# Patient Record
Sex: Female | Born: 1985 | Race: Black or African American | Hispanic: No | Marital: Single | Smoking: Never smoker
Health system: Southern US, Community
[De-identification: ages and names within clinical notes are randomized; demographics above are authoritative.]

## PROBLEM LIST (undated history)

## (undated) ENCOUNTER — Inpatient Hospital Stay (HOSPITAL_COMMUNITY): Payer: Self-pay

## (undated) DIAGNOSIS — F329 Major depressive disorder, single episode, unspecified: Secondary | ICD-10-CM

## (undated) DIAGNOSIS — F419 Anxiety disorder, unspecified: Secondary | ICD-10-CM

## (undated) DIAGNOSIS — D219 Benign neoplasm of connective and other soft tissue, unspecified: Secondary | ICD-10-CM

## (undated) DIAGNOSIS — Z8759 Personal history of other complications of pregnancy, childbirth and the puerperium: Secondary | ICD-10-CM

## (undated) DIAGNOSIS — Z5189 Encounter for other specified aftercare: Secondary | ICD-10-CM

## (undated) HISTORY — DX: Personal history of other complications of pregnancy, childbirth and the puerperium: Z87.59

## (undated) HISTORY — DX: Encounter for other specified aftercare: Z51.89

## (undated) HISTORY — PX: NO PAST SURGERIES: SHX2092

---

## 2012-03-28 ENCOUNTER — Encounter (HOSPITAL_COMMUNITY): Payer: Self-pay | Admitting: Emergency Medicine

## 2012-03-28 ENCOUNTER — Emergency Department (HOSPITAL_COMMUNITY): Payer: No Typology Code available for payment source

## 2012-03-28 ENCOUNTER — Emergency Department (HOSPITAL_COMMUNITY)
Admission: EM | Admit: 2012-03-28 | Discharge: 2012-03-28 | Disposition: A | Discharge status: Home / Self Care | Payer: No Typology Code available for payment source | Attending: Emergency Medicine | Admitting: Emergency Medicine

## 2012-03-28 DIAGNOSIS — M545 Low back pain, unspecified: Secondary | ICD-10-CM | POA: Insufficient documentation

## 2012-03-28 DIAGNOSIS — M542 Cervicalgia: Secondary | ICD-10-CM | POA: Insufficient documentation

## 2012-03-28 DIAGNOSIS — M546 Pain in thoracic spine: Secondary | ICD-10-CM | POA: Insufficient documentation

## 2012-03-28 MED ORDER — DIAZEPAM 5 MG PO TABS: 5.0000 mg | ORAL_TABLET | Freq: Two times a day (BID) | ORAL | Status: DC

## 2012-03-28 MED ORDER — HYDROCODONE-ACETAMINOPHEN 5-325 MG PO TABS: 2.0000 | ORAL_TABLET | Freq: Four times a day (QID) | ORAL | Status: DC | PRN
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
## 2012-03-29 NOTE — ED Provider Notes (Signed)
Medical screening examination/treatment/procedure(s) were performed by non-physician practitioner and as supervising physician I was immediately available for consultation/collaboration.  Hadessah Grennan, MD 03/29/12 1210 

## 2014-02-27 ENCOUNTER — Emergency Department (HOSPITAL_COMMUNITY): Payer: Medicaid Other

## 2014-02-27 ENCOUNTER — Emergency Department (HOSPITAL_COMMUNITY)
Admission: EM | Admit: 2014-02-27 | Discharge: 2014-02-27 | Disposition: A | Discharge status: Home / Self Care | Payer: Medicaid Other | Attending: Emergency Medicine | Admitting: Emergency Medicine

## 2014-02-27 ENCOUNTER — Encounter (HOSPITAL_COMMUNITY): Payer: Self-pay | Admitting: Emergency Medicine

## 2014-02-27 DIAGNOSIS — Z3490 Encounter for supervision of normal pregnancy, unspecified, unspecified trimester: Secondary | ICD-10-CM

## 2014-02-27 DIAGNOSIS — O209 Hemorrhage in early pregnancy, unspecified: Secondary | ICD-10-CM | POA: Diagnosis present

## 2014-02-27 DIAGNOSIS — O200 Threatened abortion: Secondary | ICD-10-CM | POA: Insufficient documentation

## 2014-02-27 LAB — URINALYSIS, ROUTINE W REFLEX MICROSCOPIC
BILIRUBIN URINE: NEGATIVE
Bilirubin Urine: NEGATIVE
GLUCOSE, UA: NEGATIVE mg/dL
GLUCOSE, UA: NEGATIVE mg/dL
HGB URINE DIPSTICK: NEGATIVE
Hgb urine dipstick: NEGATIVE
KETONES UR: NEGATIVE mg/dL
Ketones, ur: NEGATIVE mg/dL
LEUKOCYTES UA: NEGATIVE
Leukocytes, UA: NEGATIVE
Nitrite: NEGATIVE
Nitrite: NEGATIVE
PH: 8.5 — AB (ref 5.0–8.0)
PH: 7.5 (ref 5.0–8.0)
Protein, ur: NEGATIVE mg/dL
Protein, ur: NEGATIVE mg/dL
SPECIFIC GRAVITY, URINE: 1.019 (ref 1.005–1.030)
SPECIFIC GRAVITY, URINE: 1.021 (ref 1.005–1.030)
UROBILINOGEN UA: 1 mg/dL (ref 0.0–1.0)
Urobilinogen, UA: 1 mg/dL (ref 0.0–1.0)

## 2014-02-27 LAB — WET PREP, GENITAL
CLUE CELLS WET PREP: NONE SEEN
TRICH WET PREP: NONE SEEN
YEAST WET PREP: NONE SEEN

## 2014-02-27 LAB — RPR

## 2014-02-27 LAB — ABO/RH: ABO/RH(D): AB POS

## 2014-02-27 LAB — POC URINE PREG, ED: Preg Test, Ur: POSITIVE — AB

## 2014-02-27 LAB — HCG, QUANTITATIVE, PREGNANCY: hCG, Beta Chain, Quant, S: 14578 m[IU]/mL — ABNORMAL HIGH (ref <5)

## 2014-02-27 NOTE — Discharge Instructions (Signed)
Continue your prenatal vitamins. Do pelvic rest until you see the OB doctors. Call the [facility] to get an appointment to be seen. If you get bleeding like a period, abdominal pain, or seem worse go to [facility] to the Maternity Admissions Unit to be evaluated.

## 2014-02-27 NOTE — ED Notes (Signed)
Pt sts positive pregnancy test at home and now c/o vaginal spotting; pt G3 P1 M1; pt sts LMP was 12/15/13

## 2014-02-27 NOTE — ED Notes (Signed)
Patient transported to Ultrasound 

## 2014-02-27 NOTE — ED Provider Notes (Signed)
CSN: [id number]     Arrival date & time 02/27/14  1043 History   First MD Initiated Contact with Patient 02/27/14 1108     Chief Complaint  Patient presents with  . Vaginal Bleeding     (Consider location/radiation/quality/duration/timing/severity/associated sxs/prior Treatment) HPI Patient states she is G3 P1 Ab1 at 3 months of pregnancy. She states her miscarriage was her first pregnancy and then she had a totally normal pregnancy with a live birth baby. She reports her last normal period started around June 15. She was not using any birth control at that time. She states however she was hoping to get pregnant. She states she had a positive pregnancy test 3 days ago when she was having nausea which made her do the test. She also has noted some tenderness of her breasts. She states about 30 minutes ago she went to the bathroom to urinate and when she wiped she saw some spots of blood on the tissue. She states there is a minimal amount of blood in the toilet water. She did not have cramping them but she started to have some mild cramping now of her lower abdomen. She denies dysuria but she has had some frequency. She is unsure of her blood type but does not recognize RhoGAM.  PCP none GYN none  History reviewed. No pertinent past medical history. History reviewed. No pertinent past surgical history. History reviewed. No pertinent family history. History  Substance Use Topics  . Smoking status: Never Smoker   . Smokeless tobacco: Never Used  . Alcohol Use: Yes     Comment: occ   employed  OB History   Grav Para Term Preterm Abortions TAB SAB Ect Mult Living   1              Review of Systems  All other systems reviewed and are negative.     Allergies  Review of patient's allergies indicates no known allergies.  Home Medications  none  BP 110/60  Pulse 81  Temp(Src) 98.8 F (37.1 C) (Oral)  Resp 20  Ht 5\' 3"  (1.6 m)  Wt 180 lb (81.647 kg)  BMI 31.89 kg/m2  SpO2 97%   LMP 12/15/2013  Vital signs normal   Physical Exam  Nursing note and vitals reviewed. Constitutional: She is oriented to person, place, and time. She appears well-developed and well-nourished.  Non-toxic appearance. She does not appear ill. No distress.  HENT:  Head: Normocephalic and atraumatic.  Right Ear: External ear normal.  Left Ear: External ear normal.  Nose: Nose normal. No mucosal edema or rhinorrhea.  Mouth/Throat: Oropharynx is clear and moist and mucous membranes are normal. No dental abscesses or uvula swelling.  Eyes: Conjunctivae and EOM are normal. Pupils are equal, round, and reactive to light.  Neck: Normal range of motion and full passive range of motion without pain. Neck supple.  Cardiovascular: Normal rate, regular rhythm and normal heart sounds.  Exam reveals no gallop and no friction rub.   No murmur heard. Pulmonary/Chest: Effort normal and breath sounds normal. No respiratory distress. She has no wheezes. She has no rhonchi. She has no rales. She exhibits no tenderness and no crepitus.  Abdominal: Soft. Normal appearance and bowel sounds are normal. She exhibits no distension. There is no tenderness. There is no rebound and no guarding.  Genitourinary:  No blood seen in the fall. There is a small amount white discharge. Her cervix appears closed and normal her uterus is normal size and nontender. Her  ovaries are not enlarged and are nontender.  Musculoskeletal: Normal range of motion. She exhibits no edema and no tenderness.  Moves all extremities well.   Neurological: She is alert and oriented to person, place, and time. She has normal strength. No cranial nerve deficit.  Skin: Skin is warm, dry and intact. No rash noted. No erythema. No pallor.  Psychiatric: She has a normal mood and affect. Her speech is normal and behavior is normal. Her mood appears not anxious.    ED Course  Procedures (including critical care time)   EMERGENCY DEPARTMENT [place]  PREGNANCY "Study: Limited Ultrasound of the Pelvis"  INDICATIONS:Pregnancy(required) and Vaginal bleeding Multiple views of the uterus and pelvic cavity are obtained with a multi-frequency probe.  APPROACH:Transabdominal   PERFORMED BY: Myself  IMAGES ARCHIVED?: Yes  LIMITATIONS: Decompressed bladder  PREGNANCY FREE FLUID: None  PREGNANCY UTERUS FINDINGS:Uterus normal size and Gestational sac noted    Pt given results of her formal US. Discussed pelvic rest, continuing prenatal vitamins and OB follow up.   Labs Review Results for orders placed during the hospital encounter of 02/27/14  WET PREP, GENITAL      Result Value Ref Range   Yeast Wet Prep HPF POC NONE SEEN  NONE SEEN   Trich, Wet Prep NONE SEEN  NONE SEEN   Clue Cells Wet Prep HPF POC NONE SEEN  NONE SEEN   WBC, Wet Prep HPF POC MODERATE (*) NONE SEEN  URINALYSIS, ROUTINE W REFLEX MICROSCOPIC      Result Value Ref Range   Color, Urine YELLOW  YELLOW   APPearance CLEAR  CLEAR   Specific Gravity, Urine 1.019  1.005 - 1.030   pH 8.5 (*) 5.0 - 8.0   Glucose, UA NEGATIVE  NEGATIVE mg/dL   Hgb urine dipstick NEGATIVE  NEGATIVE   Bilirubin Urine NEGATIVE  NEGATIVE   Ketones, ur NEGATIVE  NEGATIVE mg/dL   Protein, ur NEGATIVE  NEGATIVE mg/dL   Urobilinogen, UA 1.0  0.0 - 1.0 mg/dL   Nitrite NEGATIVE  NEGATIVE   Leukocytes, UA NEGATIVE  NEGATIVE  HCG, QUANTITATIVE, PREGNANCY      Result Value Ref Range   hCG, Beta Chain, Quant, S 14578 (*) <5 mIU/mL  URINALYSIS, ROUTINE W REFLEX MICROSCOPIC      Result Value Ref Range   Color, Urine YELLOW  YELLOW   APPearance CLEAR  CLEAR   Specific Gravity, Urine 1.021  1.005 - 1.030   pH 7.5  5.0 - 8.0   Glucose, UA NEGATIVE  NEGATIVE mg/dL   Hgb urine dipstick NEGATIVE  NEGATIVE   Bilirubin Urine NEGATIVE  NEGATIVE   Ketones, ur NEGATIVE  NEGATIVE mg/dL   Protein, ur NEGATIVE  NEGATIVE mg/dL   Urobilinogen, UA 1.0  0.0 - 1.0 mg/dL   Nitrite NEGATIVE  NEGATIVE    Leukocytes, UA NEGATIVE  NEGATIVE  POC URINE PREG, ED      Result Value Ref Range   Preg Test, Ur POSITIVE (*) NEGATIVE  ABO/RH      Result Value Ref Range   ABO/RH(D) AB POS     No rh immune globuloin NOT A RH IMMUNE GLOBULIN CANDIDATE, PT RH POSITIVE     Laboratory interpretation all normal      Imaging Review US Ob Comp Less 14 Wks  02/27/2014   CLINICAL DATA:  Vaginal bleeding  EXAM: OBSTETRIC <14 WK [place] AND TRANSVAGINAL OB US  TECHNIQUE: Both transabdominal and transvaginal ultrasound examinations were performed for complete evaluation of the  gestation as well as the maternal uterus, adnexal regions, and pelvic cul-de-sac. Transvaginal technique was performed to assess early pregnancy.  COMPARISON:  None.  FINDINGS: Intrauterine gestational sac: Visualized/normal in shape.  Yolk sac:  Visualized  Embryo:  Not visualized  Cardiac Activity: Not visualized  MSD:  13  mm   6 w   1  d  Maternal uterus/adnexae: There is no evidence suggesting subchorionic hemorrhage. Cervical os is closed. No masses are identified within the maternal uterus. The right ovary measures 4.5 x 2.4 x 2.2 cm. Left ovary measures 2.5 x 1.6 x 2.7 cm. There is an apparent corpus luteum on the right measuring 2.4 x 2.3 x 1.9 cm. There is a small amount of free fluid in the cul-de-sac.  IMPRESSION: Within the uterus, there is a gestational sac which contains a yolk sac. Fetal pole is not seen at this time. In this regard, advise followup study in approximately 10-14 days to further assess. Small amount of free fluid in cul-de-sac. Corpus luteum right ovary.   Electronically Signed   By: Lowella Grip M.D.   On: 02/27/2014 14:08     EKG Interpretation None      MDM   Final diagnoses:  Pregnancy  Threatened abortion in early pregnancy     Plan discharge   Rolland Porter, MD, Alanson Aly, MD 02/27/14 1504

## 2014-02-27 NOTE — ED Notes (Signed)
Patient returned from Ultrasound. 

## 2014-02-28 LAB — GC/CHLAMYDIA PROBE AMP
CT PROBE, AMP APTIMA: NEGATIVE
GC PROBE AMP APTIMA: NEGATIVE

## 2014-02-28 LAB — HIV ANTIBODY (ROUTINE TESTING W REFLEX): HIV 1&2 Ab, 4th Generation: NONREACTIVE

## 2014-03-26 ENCOUNTER — Encounter (HOSPITAL_COMMUNITY): Payer: Self-pay | Admitting: *Deleted

## 2014-03-26 ENCOUNTER — Inpatient Hospital Stay (HOSPITAL_COMMUNITY)
Admission: AD | Admit: 2014-03-26 | Discharge: 2014-03-26 | Disposition: A | Discharge status: Home / Self Care | Payer: Medicaid Other | Source: Ambulatory Visit | Attending: Family Medicine | Admitting: Family Medicine

## 2014-03-26 DIAGNOSIS — O210 Mild hyperemesis gravidarum: Secondary | ICD-10-CM | POA: Insufficient documentation

## 2014-03-26 DIAGNOSIS — R42 Dizziness and giddiness: Secondary | ICD-10-CM | POA: Insufficient documentation

## 2014-03-26 DIAGNOSIS — R519 Headache, unspecified: Secondary | ICD-10-CM

## 2014-03-26 DIAGNOSIS — O26891 Other specified pregnancy related conditions, first trimester: Secondary | ICD-10-CM

## 2014-03-26 DIAGNOSIS — O9989 Other specified diseases and conditions complicating pregnancy, childbirth and the puerperium: Secondary | ICD-10-CM

## 2014-03-26 DIAGNOSIS — R51 Headache: Secondary | ICD-10-CM

## 2014-03-26 LAB — CBC
HCT: 35.2 % — ABNORMAL LOW (ref 36.0–46.0)
Hemoglobin: 12 g/dL (ref 12.0–15.0)
MCH: 29.2 pg (ref 26.0–34.0)
MCHC: 34.1 g/dL (ref 30.0–36.0)
MCV: 85.6 fL (ref 78.0–100.0)
PLATELETS: 275 10*3/uL (ref 150–400)
RBC: 4.11 MIL/uL (ref 3.87–5.11)
RDW: 13.4 % (ref 11.5–15.5)
WBC: 5.3 10*3/uL (ref 4.0–10.5)

## 2014-03-26 LAB — URINALYSIS, ROUTINE W REFLEX MICROSCOPIC
Bilirubin Urine: NEGATIVE
Glucose, UA: NEGATIVE mg/dL
Hgb urine dipstick: NEGATIVE
KETONES UR: NEGATIVE mg/dL
LEUKOCYTES UA: NEGATIVE
NITRITE: NEGATIVE
PROTEIN: NEGATIVE mg/dL
Specific Gravity, Urine: 1.015 (ref 1.005–1.030)
UROBILINOGEN UA: 1 mg/dL (ref 0.0–1.0)
pH: 7.5 (ref 5.0–8.0)

## 2014-03-26 LAB — COMPREHENSIVE METABOLIC PANEL
ALBUMIN: 3.2 g/dL — AB (ref 3.5–5.2)
ALK PHOS: 42 U/L (ref 39–117)
ALT: 7 U/L (ref 0–35)
ANION GAP: 12 (ref 5–15)
AST: 12 U/L (ref 0–37)
BILIRUBIN TOTAL: 0.2 mg/dL — AB (ref 0.3–1.2)
BUN: 11 mg/dL (ref 6–23)
CHLORIDE: 103 meq/L (ref 96–112)
CO2: 23 mEq/L (ref 19–32)
CREATININE: 0.74 mg/dL (ref 0.50–1.10)
Calcium: 9.2 mg/dL (ref 8.4–10.5)
GFR calc Af Amer: >90 mL/min (ref >90)
GFR calc non Af Amer: >90 mL/min (ref >90)
Glucose, Bld: 88 mg/dL (ref 70–99)
POTASSIUM: 4 meq/L (ref 3.7–5.3)
Sodium: 138 mEq/L (ref 137–147)
TOTAL PROTEIN: 7.1 g/dL (ref 6.0–8.3)

## 2014-03-26 MED ORDER — CYCLOBENZAPRINE HCL 10 MG PO TABS: 5.0000 mg | ORAL_TABLET | Freq: Three times a day (TID) | ORAL | Status: DC | PRN

## 2014-03-26 NOTE — MAU Provider Note (Signed)
Attestation of Attending Supervision of Advanced Practitioner (PA/CNM/NP): Evaluation and management procedures were performed by the Advanced Practitioner under my supervision and collaboration.  I have reviewed the Advanced Practitioner's note and chart, and I agree with the management and plan.  Donnamae Jude, MD [facility] Attending 03/26/2014 11:07 PM

## 2014-03-26 NOTE — Discharge Instructions (Signed)
Dizziness °Dizziness is a common problem. It is a feeling of unsteadiness or light-headedness. You may feel like you are about to faint. Dizziness can lead to injury if you stumble or fall. A person of any age group can suffer from dizziness, but dizziness is more common in older adults. °CAUSES  °Dizziness can be caused by many different things, including: °· Middle ear problems. °· Standing for too long. °· Infections. °· An allergic reaction. °· Aging. °· An emotional response to something, such as the sight of blood. °· Side effects of medicines. °· Tiredness. °· Problems with circulation or blood pressure. °· Excessive use of alcohol or medicines, or illegal drug use. °· Breathing too fast (hyperventilation). °· An irregular heart rhythm (arrhythmia). °· A low red blood cell count (anemia). °· Pregnancy. °· Vomiting, diarrhea, fever, or other illnesses that cause body fluid loss (dehydration). °· Diseases or conditions such as Parkinson's disease, high blood pressure (hypertension), diabetes, and thyroid problems. °· Exposure to extreme heat. °DIAGNOSIS  °Your health care provider will ask about your symptoms, perform a physical exam, and perform an electrocardiogram (ECG) to record the electrical activity of your heart. Your health care provider may also perform other heart or blood tests to determine the cause of your dizziness. These may include: °· Transthoracic echocardiogram (TTE). During echocardiography, sound waves are used to evaluate how blood flows through your heart. °· Transesophageal echocardiogram (TEE). °· Cardiac monitoring. This allows your health care provider to monitor your heart rate and rhythm in real time. °· Holter monitor. This is a portable device that records your heartbeat and can help diagnose heart arrhythmias. It allows your health care provider to track your heart activity for several days if needed. °· Stress tests by exercise or by giving medicine that makes the heart beat  faster. °TREATMENT  °Treatment of dizziness depends on the cause of your symptoms and can vary greatly. °HOME CARE INSTRUCTIONS  °· Drink enough fluids to keep your urine clear or pale yellow. This is especially important in very hot weather. In older adults, it is also important in cold weather. °· Take your medicine exactly as directed if your dizziness is caused by medicines. When taking blood pressure medicines, it is especially important to get up slowly. °¨ Rise slowly from chairs and steady yourself until you feel okay. °¨ In the morning, first sit up on the side of the bed. When you feel okay, stand slowly while holding onto something until you know your balance is fine. °· Move your legs often if you need to stand in one place for a long time. Tighten and relax your muscles in your legs while standing. °· Have someone stay with you for 1-2 days if dizziness continues to be a problem. Do this until you feel you are well enough to stay alone. Have the person call your health care provider if he or she notices changes in you that are concerning. °· Do not drive or use heavy machinery if you feel dizzy. °· Do not drink alcohol. °SEEK IMMEDIATE MEDICAL CARE IF:  °· Your dizziness or light-headedness gets worse. °· You feel nauseous or vomit. °· You have problems talking, walking, or using your arms, hands, or legs. °· You feel weak. °· You are not thinking clearly or you have trouble forming sentences. It may take a friend or family member to notice this. °· You have chest pain, abdominal pain, shortness of breath, or sweating. °· Your vision changes. °· You notice   any bleeding.  You have side effects from medicine that seems to be getting worse rather than better. MAKE SURE YOU:   Understand these instructions.  Will watch your condition.  Will get help right away if you are not doing well or get worse. Document Released: 12/13/2000 Document Revised: 06/24/2013 Document Reviewed: 01/06/2011 [facility]  Patient Information 2015 [place], [place]. This information is not intended to replace advice given to you by your health care provider. Make sure you discuss any questions you have with your health care provider.  Tension Headache A tension headache is a feeling of pain, pressure, or aching often felt over the front and sides of the head. The pain can be dull or can feel tight (constricting). It is the most common type of headache. Tension headaches are not normally associated with nausea or vomiting and do not get worse with physical activity. Tension headaches can last 30 minutes to several days.  CAUSES  The exact cause is not known, but it may be caused by chemicals and hormones in the brain that lead to pain. Tension headaches often begin after stress, anxiety, or depression. Other triggers may include:  Alcohol.  Caffeine (too much or withdrawal).  Respiratory infections (colds, flu, sinus infections).  Dental problems or teeth clenching.  Fatigue.  Holding your head and neck in one position too long while using a computer. SYMPTOMS   Pressure around the head.   Dull, aching head pain.   Pain felt over the front and sides of the head.   Tenderness in the muscles of the head, neck, and shoulders. DIAGNOSIS  A tension headache is often diagnosed based on:   Symptoms.   Physical examination.   A CT scan or MRI of your head. These tests may be ordered if symptoms are severe or unusual. TREATMENT  Medicines may be given to help relieve symptoms.  HOME CARE INSTRUCTIONS   Only take over-the-counter or prescription medicines for pain or discomfort as directed by your caregiver.   Lie down in a dark, quiet room when you have a headache.   Keep a journal to find out what may be triggering your headaches. For example, write down:  What you eat and drink.  How much sleep you get.  Any change to your diet or medicines.  Try massage or other relaxation techniques.    Ice packs or heat applied to the head and neck can be used. Use these 3 to 4 times per day for 15 to 20 minutes each time, or as needed.   Limit stress.   Sit up straight, and do not tense your muscles.   Quit smoking if you smoke.  Limit alcohol use.  Decrease the amount of caffeine you drink, or stop drinking caffeine.  Eat and exercise regularly.  Get 7 to 9 hours of sleep, or as recommended by your caregiver.  Avoid excessive use of pain medicine as recurrent headaches can occur.  SEEK MEDICAL CARE IF:   You have problems with the medicines you were prescribed.  Your medicines do not work.  You have a change from the usual headache.  You have nausea or vomiting. SEEK IMMEDIATE MEDICAL CARE IF:   Your headache becomes severe.  You have a fever.  You have a stiff neck.  You have loss of vision.  You have muscular weakness or loss of muscle control.  You lose your balance or have trouble walking.  You feel faint or pass out.  You have severe symptoms  that are different from your first symptoms. MAKE SURE YOU:   Understand these instructions.  Will watch your condition.  Will get help right away if you are not doing well or get worse. Document Released: 06/19/2005 Document Revised: 09/11/2011 Document Reviewed: 06/09/2011 [facility] Patient Information 2015 [place], [place]. This information is not intended to replace advice given to you by your health care provider. Make sure you discuss any questions you have with your health care provider.

## 2014-03-26 NOTE — MAU Provider Note (Signed)
History  Jocelyn Lucas 28 y.o. G2P1001 @[redacted]w[redacted]d  arrived at MAU complaining of nausea, h/a, and dizziness. She describes the nausea as a chronic complain that has been improving over the past few weeks, she has not had any vomiting episodes since taking promethazine prescribed to her at her last MAU visit. She reports the headaches starting about a week ago. The H/A are episodic, occuring in the morning and in the afternoon around 4PM on average. She grades the pain at 8/10 with nothing alleviating her H/A pain. She had a dizzy spell and "seeing black spots" this morning while curling her hair, she denies making any sudden movements before the dizziness and reports the dizziness going away on its own. She has appointment at [facility] for prenatal care in 2 weeks.  ROS- See below  CSN: [id number]  Arrival date and time: 03/26/14 1120   None     Chief Complaint  Patient presents with  . Dizziness   Dizziness Associated symptoms include headaches and nausea. Pertinent negatives include no abdominal pain, chest pain, chills, congestion, coughing, diaphoresis, fever, rash, sore throat, vomiting or weakness.    OB History   Grav Para Term Preterm Abortions TAB SAB Ect Mult Living   2 1 1       1       Past Medical History  Diagnosis Date  . Medical history non-contributory     Past Surgical History  Procedure Laterality Date  . No past surgeries      History reviewed. No pertinent family history.  History  Substance Use Topics  . Smoking status: Never Smoker   . Smokeless tobacco: Never Used  . Alcohol Use: Yes     Comment: occ    Allergies: No Known Allergies  Prescriptions prior to admission  Medication Sig Dispense Refill  . docusate sodium (COLACE) 100 MG capsule Take 100 mg by mouth 2 (two) times daily.      . Prenatal Vit-Fe Fumarate-FA (PRENATAL VITAMIN PO) Take 1 tablet by mouth daily.      . promethazine (PHENERGAN) 25 MG tablet Take 25 mg by mouth every 6 (six) hours as  needed for nausea or vomiting.        Review of Systems  Constitutional: Positive for malaise/fatigue. Negative for fever, chills, weight loss and diaphoresis.  HENT: Negative for congestion, ear pain, hearing loss, nosebleeds and sore throat.   Eyes: Negative for blurred vision.  Respiratory: Negative for cough and shortness of breath.   Cardiovascular: Negative for chest pain.  Gastrointestinal: Positive for heartburn, nausea and constipation. Negative for vomiting, abdominal pain and diarrhea.  Genitourinary: Negative for dysuria, urgency and frequency.  Skin: Negative for rash.  Neurological: Positive for dizziness and headaches. Negative for tingling, loss of consciousness and weakness.  Psychiatric/Behavioral: Negative for depression, suicidal ideas and substance abuse. The patient is not nervous/anxious.    Physical Exam   Blood pressure 108/66, pulse 84, temperature 97.5 F (36.4 C), resp. rate 18, height 5\' 4"  (1.626 m), weight 84.823 kg (187 lb), last menstrual period 12/15/2013.  Physical Exam  Constitutional: She is oriented to person, place, and time. She appears well-developed and well-nourished. No distress.  HENT:  Head: Normocephalic and atraumatic.  Neck: Normal range of motion.  Cardiovascular: Normal rate, regular rhythm and normal heart sounds.   Respiratory: Breath sounds normal.  GI: Soft. Bowel sounds are normal.  Neurological: She is alert and oriented to person, place, and time.   Bedside sono by Manya Silvas, CNM, indicates  9 week IUP by CRL with FHR 167.  Results for orders placed during the hospital encounter of 03/26/14 (from the past 24 hour(s))  URINALYSIS, ROUTINE W REFLEX MICROSCOPIC     Status: None   Collection Time    03/26/14 11:40 AM      Result Value Ref Range   Color, Urine YELLOW  YELLOW   APPearance CLEAR  CLEAR   Specific Gravity, Urine 1.015  1.005 - 1.030   pH 7.5  5.0 - 8.0   Glucose, UA NEGATIVE  NEGATIVE mg/dL   Hgb urine  dipstick NEGATIVE  NEGATIVE   Bilirubin Urine NEGATIVE  NEGATIVE   Ketones, ur NEGATIVE  NEGATIVE mg/dL   Protein, ur NEGATIVE  NEGATIVE mg/dL   Urobilinogen, UA 1.0  0.0 - 1.0 mg/dL   Nitrite NEGATIVE  NEGATIVE   Leukocytes, UA NEGATIVE  NEGATIVE  CBC     Status: Abnormal   Collection Time    03/26/14 12:41 PM      Result Value Ref Range   WBC 5.3  4.0 - 10.5 K/uL   RBC 4.11  3.87 - 5.11 MIL/uL   Hemoglobin 12.0  12.0 - 15.0 g/dL   HCT 35.2 (*) 36.0 - 46.0 %   MCV 85.6  78.0 - 100.0 fL   MCH 29.2  26.0 - 34.0 pg   MCHC 34.1  30.0 - 36.0 g/dL   RDW 13.4  11.5 - 15.5 %   Platelets 275  150 - 400 K/uL  COMPREHENSIVE METABOLIC PANEL     Status: Abnormal   Collection Time    03/26/14 12:41 PM      Result Value Ref Range   Sodium 138  137 - 147 mEq/L   Potassium 4.0  3.7 - 5.3 mEq/L   Chloride 103  96 - 112 mEq/L   CO2 23  19 - 32 mEq/L   Glucose, Bld 88  70 - 99 mg/dL   BUN 11  6 - 23 mg/dL   Creatinine, Ser 0.74  0.50 - 1.10 mg/dL   Calcium 9.2  8.4 - 10.5 mg/dL   Total Protein 7.1  6.0 - 8.3 g/dL   Albumin 3.2 (*) 3.5 - 5.2 g/dL   AST 12  0 - 37 U/L   ALT 7  0 - 35 U/L   Alkaline Phosphatase 42  39 - 117 U/L   Total Bilirubin 0.2 (*) 0.3 - 1.2 mg/dL   GFR calc non Af Amer >90  >90 mL/min   GFR calc Af Amer >90  >90 mL/min   Anion gap 12  5 - 15   MAU Course  Procedures Bedside Ultrasound MDM Patient was given reassurance and talked to about the effects of dehydration and stress. Bedside ultrasound was done to evaluate for IUP and cardiac activity.  Assessment and Plan  Increase fluid intake Flexeril 10 mg PRN before bed and for headaches follow up with prenatal care as planned Return to MAU as needed for emergencies   [name] 03/26/2014, 12:05 PM   I have seen this patient and agree with the above PA student's note.  LEFTWICH-KIRBY, [place] Certified Nurse-Midwife

## 2014-03-26 NOTE — MAU Note (Signed)
Dizzy episode this morning.  Head hurting, feels bad, third time this has happened. Also having pelvic pain, no bleeding.

## 2014-04-09 LAB — OB RESULTS CONSOLE GC/CHLAMYDIA
Chlamydia: NEGATIVE
Gonorrhea: NEGATIVE

## 2014-04-09 LAB — OB RESULTS CONSOLE ANTIBODY SCREEN: Antibody Screen: NEGATIVE

## 2014-04-09 LAB — OB RESULTS CONSOLE RUBELLA ANTIBODY, IGM: RUBELLA: IMMUNE

## 2014-04-09 LAB — OB RESULTS CONSOLE HEPATITIS B SURFACE ANTIGEN: HEP B S AG: NEGATIVE

## 2014-04-09 LAB — OB RESULTS CONSOLE ABO/RH: RH Type: POSITIVE

## 2014-04-09 LAB — OB RESULTS CONSOLE HIV ANTIBODY (ROUTINE TESTING): HIV: NONREACTIVE

## 2014-04-09 LAB — OB RESULTS CONSOLE RPR: RPR: NONREACTIVE

## 2014-05-04 ENCOUNTER — Encounter (HOSPITAL_COMMUNITY): Payer: Self-pay | Admitting: *Deleted

## 2014-07-03 NOTE — L&D Delivery Note (Signed)
   Operative Delivery Note At 3:07 PM a viable female, "Ovid Curd", was delivered via Vaginal, Spontaneous Delivery.  Presentation: vertex; Position: Left,, Occiput,, Anterior   Delivery of the head: 10/28/2014  3:06 PM First maneuver: 10/28/2014  3:06 PM, McRoberts Second maneuver: 10/28/2014  3:06 PM, Suprapubic Pressure Third maneuver: 10/28/2014  3:07 PM,  Rotation from ROA to LOA, with release of anterior shoulder.  APGAR: 9, 9; weight  .   Placenta status: Intact, Spontaneous, small area of velamentous insertion noted just above body of placenta, no disruption of vessels noted. Cord: 3 vessels with the following complications: None.  Cord pH: NA  Several gushes of blood after delivery of placenta, with intermittent uterine atony that resolved each time with fundal massage. Cytotech 1000 mcg placed in rectum.  Patient remained alert and stable throughout episodes.  Anesthesia: Epidural  Episiotomy: None Lacerations: None Suture Repair: None Est. Blood Loss (mL): 636 cc  Filed Vitals:   10/28/14 1525 10/28/14 1531 10/28/14 1532 10/28/14 1545  BP: 88/53 96/56 103/72 104/48  Pulse: 112 115 118 113  Temp:      TempSrc:      Resp: 20  20 18   Height:      Weight:      SpO2:    100%   Episodes of tachycardia prior to delivery, with maternal HR 98-122 at time.  Patient asymptomatic then and now.  Mom to postpartum.  Baby to Couplet care / Skin to Skin. Patient declines contraception due to female partner. Placenta to pathology.  Will check CBC in early pp phase, then on day 1. Methergine po course initiated for bleeding prophylaxis.  Donnel Saxon 10/28/2014, 3:49 PM

## 2014-10-05 LAB — OB RESULTS CONSOLE GBS: GBS: POSITIVE

## 2014-10-21 ENCOUNTER — Encounter (HOSPITAL_COMMUNITY): Payer: Self-pay | Admitting: *Deleted

## 2014-10-21 ENCOUNTER — Telehealth (HOSPITAL_COMMUNITY): Payer: Self-pay | Admitting: *Deleted

## 2014-10-21 NOTE — Telephone Encounter (Signed)
Preadmission screen  

## 2014-10-27 ENCOUNTER — Inpatient Hospital Stay (HOSPITAL_COMMUNITY)
Admission: RE | Admit: 2014-10-27 | Discharge: 2014-10-29 | DRG: 775 | Disposition: A | Discharge status: Home / Self Care | Payer: Medicaid Other | Source: Ambulatory Visit | Attending: Obstetrics and Gynecology | Admitting: Obstetrics and Gynecology

## 2014-10-27 DIAGNOSIS — O43129 Velamentous insertion of umbilical cord, unspecified trimester: Secondary | ICD-10-CM | POA: Diagnosis present

## 2014-10-27 DIAGNOSIS — O43123 Velamentous insertion of umbilical cord, third trimester: Secondary | ICD-10-CM | POA: Diagnosis present

## 2014-10-27 DIAGNOSIS — B951 Streptococcus, group B, as the cause of diseases classified elsewhere: Secondary | ICD-10-CM

## 2014-10-27 DIAGNOSIS — I959 Hypotension, unspecified: Secondary | ICD-10-CM | POA: Diagnosis present

## 2014-10-27 DIAGNOSIS — O9902 Anemia complicating childbirth: Secondary | ICD-10-CM | POA: Diagnosis present

## 2014-10-27 DIAGNOSIS — O99824 Streptococcus B carrier state complicating childbirth: Principal | ICD-10-CM | POA: Diagnosis present

## 2014-10-27 LAB — CBC
HEMATOCRIT: 30.3 % — AB (ref 36.0–46.0)
Hemoglobin: 10.3 g/dL — ABNORMAL LOW (ref 12.0–15.0)
MCH: 28.3 pg (ref 26.0–34.0)
MCHC: 34 g/dL (ref 30.0–36.0)
MCV: 83.2 fL (ref 78.0–100.0)
Platelets: 264 10*3/uL (ref 150–400)
RBC: 3.64 MIL/uL — ABNORMAL LOW (ref 3.87–5.11)
RDW: 14.6 % (ref 11.5–15.5)
WBC: 6.9 10*3/uL (ref 4.0–10.5)

## 2014-10-27 LAB — TYPE AND SCREEN
ABO/RH(D): AB POS
Antibody Screen: NEGATIVE

## 2014-10-27 MED ORDER — OXYCODONE-ACETAMINOPHEN 5-325 MG PO TABS: 2.0000 | ORAL_TABLET | ORAL | Status: DC | PRN

## 2014-10-27 MED ORDER — OXYTOCIN 40 UNITS IN LACTATED RINGERS INFUSION - SIMPLE MED: 62.5000 mL/h | INTRAVENOUS | Status: DC

## 2014-10-27 MED ORDER — LACTATED RINGERS IV SOLN
INTRAVENOUS | Status: DC
  Administered 2014-10-28: 04:00:00 via INTRAVENOUS

## 2014-10-27 MED ORDER — DEXTROSE 5 % IV SOLN
2.5000 10*6.[IU] | INTRAVENOUS | Status: DC
  Filled 2014-10-27 (×2): qty 2.5

## 2014-10-27 MED ORDER — ZOLPIDEM TARTRATE 5 MG PO TABS: 5.0000 mg | ORAL_TABLET | Freq: Every evening | ORAL | Status: DC | PRN

## 2014-10-27 MED ORDER — LACTATED RINGERS IV SOLN
500.0000 mL | INTRAVENOUS | Status: DC | PRN
  Administered 2014-10-28 (×2): 500 mL via INTRAVENOUS

## 2014-10-27 MED ORDER — TERBUTALINE SULFATE 1 MG/ML IJ SOLN: 0.2500 mg | Freq: Once | INTRAMUSCULAR | Status: AC | PRN

## 2014-10-27 MED ORDER — MISOPROSTOL 25 MCG QUARTER TABLET
25.0000 ug | ORAL_TABLET | ORAL | Status: DC | PRN
Start: 2014-10-27 — End: 2014-10-28
  Administered 2014-10-27 – 2014-10-28 (×2): 25 ug via VAGINAL
  Filled 2014-10-27: qty 1
  Filled 2014-10-27 (×2): qty 0.25

## 2014-10-27 MED ORDER — OXYTOCIN 40 UNITS IN LACTATED RINGERS INFUSION - SIMPLE MED
1.0000 m[IU]/min | INTRAVENOUS | Status: DC
  Administered 2014-10-28: 2 m[IU]/min via INTRAVENOUS
  Filled 2014-10-27: qty 1000

## 2014-10-27 MED ORDER — CITRIC ACID-SODIUM CITRATE 334-500 MG/5ML PO SOLN
30.0000 mL | ORAL | Status: DC | PRN
  Administered 2014-10-28: 30 mL via ORAL
  Filled 2014-10-27: qty 15

## 2014-10-27 MED ORDER — ONDANSETRON HCL 4 MG/2ML IJ SOLN: 4.0000 mg | Freq: Four times a day (QID) | INTRAMUSCULAR | Status: DC | PRN

## 2014-10-27 MED ORDER — PENICILLIN G POTASSIUM 5000000 UNITS IJ SOLR
5.0000 10*6.[IU] | Freq: Once | INTRAVENOUS | Status: DC
  Filled 2014-10-27: qty 5

## 2014-10-27 MED ORDER — OXYCODONE-ACETAMINOPHEN 5-325 MG PO TABS: 1.0000 | ORAL_TABLET | ORAL | Status: DC | PRN

## 2014-10-27 MED ORDER — LIDOCAINE HCL (PF) 1 % IJ SOLN
30.0000 mL | INTRAMUSCULAR | Status: DC | PRN
  Filled 2014-10-27: qty 30

## 2014-10-27 MED ORDER — OXYTOCIN BOLUS FROM INFUSION: 500.0000 mL | INTRAVENOUS | Status: DC

## 2014-10-27 MED ORDER — ACETAMINOPHEN 325 MG PO TABS: 650.0000 mg | ORAL_TABLET | ORAL | Status: DC | PRN

## 2014-10-27 NOTE — H&P (Signed)
Jocelyn Lucas is a 29 y.o. female, G3P1011 at 40 weeks, presenting for IOL for velamentous cord insertion.  Patient is GBS positive and has had no other significant issues throughout the pregnancy.  Patient desires epidural.  Patient Active Problem List   Diagnosis Date Noted  . Velamentous insertion of umbilical cord 16/04/9603    History of present pregnancy: Patient entered care at 13.6 weeks.   EDC of 10/27/2014 was established by 13.6wk [place] on 04/22/2014.   Anatomy scan:  20 weeks, with abnormal findings significant for velamentous cord insertion and an posterior placenta.   Additional [place] evaluations:   -32wks: Growth U/S - SIUP, VERTEX, NORMAL FLUID, GROWTH 38%, CX 3.24 CM.   -36wks:  [place]: SIUP, VERTEX, NORMAL FLUID, GROWTH 26% Significant prenatal events:  Patient with common pregnancy complaints including back pain, pelvic pressure, insomnia, and vaginal spotting.  Patient also treated for UTI at 38wks.    Last evaluation:  10/20/2014 by Dr. Carmon Sails.  VE: 0/30/-3, FHR 158, BP 90/60, Wt 205lbs  OB History    Gravida Para Term Preterm AB TAB SAB Ectopic Multiple Living   4 2 2  1  1   1      Past Medical History  Diagnosis Date  . Medical history non-contributory    Past Surgical History  Procedure Laterality Date  . No past surgeries     Family History: family history is not on file. Social History:  reports that she has never smoked. She has never used smokeless tobacco. She reports that she drinks alcohol. She reports that she does not use illicit drugs.  Patient is with partner Jocelyn Lucas who is involved.  Prenatal Transfer Tool  Maternal Diabetes: No Genetic Screening: Normal Maternal Ultrasounds/Referrals: Abnormal:  Findings:   Other:Velamentous Insertion Fetal Ultrasounds or other Referrals:  None Maternal Substance Abuse:  No Significant Maternal Medications:  Meds include: Other:  Significant Maternal Lab Results: Lab values include: Group B Strep positive    ROS:   +FM, -LOF, -Ctx, -VB  No Known Allergies    Height 5\' 3"  (1.6 m), weight 92.534 kg (204 lb), last menstrual period 12/15/2013.  FHR: 150 bpm, Mod Var, -Decels, +Accels UCs:  None graphed  Prenatal labs: ABO, Rh: AB/Positive/-- (10/08 0000) Antibody: Negative (10/08 0000) Rubella:   Immune RPR: Nonreactive (10/08 0000)  HBsAg: Negative (10/08 0000)  HIV: Non-reactive (10/08 0000)  GBS: Positive (04/04 0000) Sickle cell/Hgb electrophoresis:  Normal Pap:  N/A GC:  Negative Chlamydia:  Negative Genetic screenings:  Normal Glucola:  Normal Other:  None    Assessment IUP at 40wks Cat I FT Velamentous Cord Insertion Induction of Labor  Plan: Admit to [facility] per consult with Dr. Octavio Manns Routine Labor and Delivery Orders per [facility] Protocol Routine Induction Orders Plan to start with cytotec Okay for regular diet prior to assessment and induction start  Jocelyn Fantasia, MSN 10/27/2014, 7:57 PM

## 2014-10-27 NOTE — Progress Notes (Signed)
Jocelyn Lucas, [id number]   Subjective -Patient ready to begin induction process after eating meal.  States she has "general understanding" of the process and has no questions or concerns.  Patient continues to deny contractions, LOF, VB, and report good fetal movement.  Patient further denies headache, visual disturbances, and epigastric pain.   Objective Filed Vitals:   10/27/14 1949  BP: 116/72  Pulse: 99  Temp: 98.4 F (36.9 C)  Resp: 18        Physical Exam  Constitutional: She is oriented to person, place, and time and well-developed, well-nourished, and in no distress.  HENT:  Head: Normocephalic and atraumatic.  Eyes: EOM are normal.  Neck: Normal range of motion.  Cardiovascular: Normal rate, regular rhythm and normal heart sounds.   Pulmonary/Chest: Effort normal and breath sounds normal.  Abdominal: Soft. Bowel sounds are normal.  Musculoskeletal: Normal range of motion.  Neurological: She is alert and oriented to person, place, and time.  Skin: Skin is warm and dry.  Psychiatric: Affect normal.    DJM:EQASTMHD: 1 Effacement (%): 50 Cervical Position: Posterior Station: -3 Presentation: Vertex Exam by:: Halea Lieb,CNM  Pelvis: -Proven:Yes -Adequate: Yes Leopolds: -Position:Vertex -EFW:7lbs to 71/2lbs  Monitoring: FHR: 145 bpm, Mod Var, -Decels, +Accels UC:  None graphed or palpated  Induction/Augmentation Agent: Pitocin: None Cytotec: 1st Dose at 2100 Membranes: Intact  Assessment IUP at 40wks Cat I FT Velamentous Cord Insertion Bishop Score: 4 Cervical Ripening   Plan -Discussed r/b of induction including fetal distress, serial induction, pain, and increased risk of c/s delivery -Discussed induction methods including cervical ripening agents, foley bulbs, and pitocin -Questions and concerns addressed -Continue other mgmt as ordered   Aitana Burry LYNN, CNM 10/27/2014, 9:07 PM

## 2014-10-28 ENCOUNTER — Inpatient Hospital Stay (HOSPITAL_COMMUNITY): Payer: Medicaid Other | Admitting: Anesthesiology

## 2014-10-28 ENCOUNTER — Encounter (HOSPITAL_COMMUNITY): Payer: Self-pay

## 2014-10-28 DIAGNOSIS — B951 Streptococcus, group B, as the cause of diseases classified elsewhere: Secondary | ICD-10-CM

## 2014-10-28 LAB — CBC
HCT: 29.7 % — ABNORMAL LOW (ref 36.0–46.0)
Hemoglobin: 9.8 g/dL — ABNORMAL LOW (ref 12.0–15.0)
MCH: 27.7 pg (ref 26.0–34.0)
MCHC: 33 g/dL (ref 30.0–36.0)
MCV: 83.9 fL (ref 78.0–100.0)
Platelets: 213 10*3/uL (ref 150–400)
RBC: 3.54 MIL/uL — ABNORMAL LOW (ref 3.87–5.11)
RDW: 14.7 % (ref 11.5–15.5)
WBC: 8.7 10*3/uL (ref 4.0–10.5)

## 2014-10-28 LAB — RPR: RPR Ser Ql: NONREACTIVE

## 2014-10-28 LAB — ABO/RH: ABO/RH(D): AB POS

## 2014-10-28 MED ORDER — SIMETHICONE 80 MG PO CHEW: 80.0000 mg | CHEWABLE_TABLET | ORAL | Status: DC | PRN

## 2014-10-28 MED ORDER — ACETAMINOPHEN 325 MG PO TABS
650.0000 mg | ORAL_TABLET | ORAL | Status: DC | PRN
  Administered 2014-10-29: 650 mg via ORAL
  Filled 2014-10-28: qty 2

## 2014-10-28 MED ORDER — FENTANYL CITRATE (PF) 100 MCG/2ML IJ SOLN
50.0000 ug | INTRAMUSCULAR | Status: DC | PRN
  Administered 2014-10-28 (×2): 100 ug via INTRAVENOUS
  Filled 2014-10-28 (×2): qty 2

## 2014-10-28 MED ORDER — FENTANYL 2.5 MCG/ML BUPIVACAINE 1/10 % EPIDURAL INFUSION (WH - ANES)
14.0000 mL/h | INTRAMUSCULAR | Status: DC | PRN
  Administered 2014-10-28 (×2): 14 mL/h via EPIDURAL
  Filled 2014-10-28: qty 125

## 2014-10-28 MED ORDER — PHENYLEPHRINE 40 MCG/ML (10ML) SYRINGE FOR IV PUSH (FOR BLOOD PRESSURE SUPPORT)
80.0000 ug | PREFILLED_SYRINGE | INTRAVENOUS | Status: DC | PRN
  Administered 2014-10-28 (×2): 80 ug via INTRAVENOUS
  Filled 2014-10-28: qty 2

## 2014-10-28 MED ORDER — ONDANSETRON HCL 4 MG/2ML IJ SOLN: 4.0000 mg | INTRAMUSCULAR | Status: DC | PRN

## 2014-10-28 MED ORDER — DIBUCAINE 1 % RE OINT: 1.0000 "application " | TOPICAL_OINTMENT | RECTAL | Status: DC | PRN

## 2014-10-28 MED ORDER — LACTATED RINGERS IV SOLN
500.0000 mL | Freq: Once | INTRAVENOUS | Status: AC
  Administered 2014-10-28: 500 mL via INTRAVENOUS

## 2014-10-28 MED ORDER — PHENYLEPHRINE 40 MCG/ML (10ML) SYRINGE FOR IV PUSH (FOR BLOOD PRESSURE SUPPORT)
80.0000 ug | PREFILLED_SYRINGE | INTRAVENOUS | Status: DC | PRN
  Filled 2014-10-28: qty 20
  Filled 2014-10-28: qty 2

## 2014-10-28 MED ORDER — EPHEDRINE 5 MG/ML INJ
10.0000 mg | INTRAVENOUS | Status: DC | PRN
  Filled 2014-10-28: qty 2

## 2014-10-28 MED ORDER — PENICILLIN G POTASSIUM 5000000 UNITS IJ SOLR
2.5000 10*6.[IU] | INTRAVENOUS | Status: DC
  Administered 2014-10-28: 2.5 10*6.[IU] via INTRAVENOUS
  Filled 2014-10-28 (×5): qty 2.5

## 2014-10-28 MED ORDER — LIDOCAINE HCL (PF) 1 % IJ SOLN
INTRAMUSCULAR | Status: DC | PRN
  Administered 2014-10-28 (×2): 4 mL

## 2014-10-28 MED ORDER — BENZOCAINE-MENTHOL 20-0.5 % EX AERO
1.0000 "application " | INHALATION_SPRAY | CUTANEOUS | Status: DC | PRN
  Administered 2014-10-28: 1 via TOPICAL
  Filled 2014-10-28: qty 56

## 2014-10-28 MED ORDER — IBUPROFEN 600 MG PO TABS
600.0000 mg | ORAL_TABLET | Freq: Four times a day (QID) | ORAL | Status: DC
  Administered 2014-10-28 – 2014-10-29 (×5): 600 mg via ORAL
  Filled 2014-10-28 (×5): qty 1

## 2014-10-28 MED ORDER — OXYCODONE-ACETAMINOPHEN 5-325 MG PO TABS: 2.0000 | ORAL_TABLET | ORAL | Status: DC | PRN

## 2014-10-28 MED ORDER — MISOPROSTOL 200 MCG PO TABS
ORAL_TABLET | ORAL | Status: AC
  Filled 2014-10-28: qty 5

## 2014-10-28 MED ORDER — PENICILLIN G POTASSIUM 5000000 UNITS IJ SOLR
5.0000 10*6.[IU] | Freq: Once | INTRAVENOUS | Status: AC
  Administered 2014-10-28: 5 10*6.[IU] via INTRAVENOUS
  Filled 2014-10-28: qty 5

## 2014-10-28 MED ORDER — TETANUS-DIPHTH-ACELL PERTUSSIS 5-2.5-18.5 LF-MCG/0.5 IM SUSP
0.5000 mL | Freq: Once | INTRAMUSCULAR | Status: AC
  Administered 2014-10-29: 0.5 mL via INTRAMUSCULAR

## 2014-10-28 MED ORDER — LANOLIN HYDROUS EX OINT: TOPICAL_OINTMENT | CUTANEOUS | Status: DC | PRN

## 2014-10-28 MED ORDER — ONDANSETRON HCL 4 MG PO TABS: 4.0000 mg | ORAL_TABLET | ORAL | Status: DC | PRN

## 2014-10-28 MED ORDER — MISOPROSTOL 200 MCG PO TABS
1000.0000 ug | ORAL_TABLET | Freq: Once | ORAL | Status: AC
  Administered 2014-10-28: 1000 ug via RECTAL

## 2014-10-28 MED ORDER — DIPHENHYDRAMINE HCL 50 MG/ML IJ SOLN
12.5000 mg | INTRAMUSCULAR | Status: DC | PRN
  Administered 2014-10-28: 12.5 mg via INTRAVENOUS
  Filled 2014-10-28: qty 1

## 2014-10-28 MED ORDER — PRENATAL MULTIVITAMIN CH
1.0000 | ORAL_TABLET | Freq: Every day | ORAL | Status: DC
  Administered 2014-10-29: 1 via ORAL
  Filled 2014-10-28: qty 1

## 2014-10-28 MED ORDER — OXYCODONE-ACETAMINOPHEN 5-325 MG PO TABS: 1.0000 | ORAL_TABLET | ORAL | Status: DC | PRN

## 2014-10-28 MED ORDER — METHYLERGONOVINE MALEATE 0.2 MG PO TABS
0.2000 mg | ORAL_TABLET | ORAL | Status: AC
  Administered 2014-10-28 – 2014-10-29 (×5): 0.2 mg via ORAL
  Filled 2014-10-28 (×5): qty 1

## 2014-10-28 MED ORDER — SENNOSIDES-DOCUSATE SODIUM 8.6-50 MG PO TABS
2.0000 | ORAL_TABLET | ORAL | Status: DC
  Administered 2014-10-29: 2 via ORAL
  Filled 2014-10-28: qty 2

## 2014-10-28 MED ORDER — ZOLPIDEM TARTRATE 5 MG PO TABS: 5.0000 mg | ORAL_TABLET | Freq: Every evening | ORAL | Status: DC | PRN

## 2014-10-28 MED ORDER — METHYLERGONOVINE MALEATE 0.2 MG/ML IJ SOLN: 0.2000 mg | INTRAMUSCULAR | Status: AC

## 2014-10-28 MED ORDER — EPHEDRINE 5 MG/ML INJ
10.0000 mg | INTRAVENOUS | Status: AC | PRN
  Administered 2014-10-28 (×2): 10 mg via INTRAVENOUS
  Filled 2014-10-28: qty 4

## 2014-10-28 MED ORDER — WITCH HAZEL-GLYCERIN EX PADS: 1.0000 "application " | MEDICATED_PAD | CUTANEOUS | Status: DC | PRN

## 2014-10-28 MED ORDER — DIPHENHYDRAMINE HCL 25 MG PO CAPS: 25.0000 mg | ORAL_CAPSULE | Freq: Four times a day (QID) | ORAL | Status: DC | PRN

## 2014-10-28 NOTE — Progress Notes (Signed)
  Subjective: Comfortable with epidural.  Family and partner at bedside.    Had SROM at 1203, clear fluid.  Objective: BP 102/53 mmHg  Pulse 105  Temp(Src) 97.7 F (36.5 C) (Oral)  Resp 18  Ht 5\' 3"  (1.6 m)  Wt 92.534 kg (204 lb)  BMI 36.15 kg/m2  SpO2 99%  LMP 12/15/2013     FHT: Category 2--mild variables with UCs UC:   irregular, every 2-5 minutes SVE:   Dilation: 5.5 Effacement (%): 80 Station: -1 Exam by:: V Lathan CNM  Vtx well-applied to cervix IUPC inserted easily  Pitocin at 14 mu/min  Received 2nd dose PCN at 1202  Assessment:  Induction for velamentous insertion Progressive labor GBS positive  Plan: Continue current care. Amnioinfusion if variables persist/deepen.  Donnel Saxon CNM 10/28/2014, 12:32 PM

## 2014-10-28 NOTE — Progress Notes (Signed)
  Subjective: Feeling mild pressure, but no urge to push yet.  Objective: BP 114/73 mmHg  Pulse 115  Temp(Src) 98.1 F (36.7 C) (Oral)  Resp 18  Ht 5\' 3"  (1.6 m)  Wt 92.534 kg (204 lb)  BMI 36.15 kg/m2  SpO2 99%  LMP 12/15/2013   Total I/O In: -  Out: 250 [Urine:250]  FHT: Category 2--mild variables/early decels with UCs UC:   regular, every 2-3 minutes SVE:   Dilation: Lip/rim Effacement (%): 80 Station: -1 Exam by:: Garold Sheeler CNM Pitocin at 16 mu/min  MVUs 225-250  Assessment:  Progressive labor  Plan: Await increased urge to push. Dr. Charlesetta Garibaldi updated.  Donnel Saxon CNM 10/28/2014, 2:34 PM

## 2014-10-28 NOTE — Anesthesia Procedure Notes (Signed)
Epidural Patient location during procedure: OB Start time: 10/28/2014 7:22 AM  Staffing Anesthesiologist: Lauretta Grill Performed by: anesthesiologist   Preanesthetic Checklist Completed: patient identified, site marked, surgical consent, pre-op evaluation, timeout performed, IV checked, risks and benefits discussed and monitors and equipment checked  Epidural Patient position: sitting Prep: site prepped and draped and DuraPrep Patient monitoring: continuous pulse ox and blood pressure Approach: midline Location: L3-L4 Injection technique: LOR saline  Needle:  Needle type: Tuohy  Needle gauge: 17 G Needle length: 9 cm and 9 Needle insertion depth: 7 cm Catheter type: closed end flexible Catheter size: 19 Gauge Catheter at skin depth: 12 cm Test dose: negative  Assessment Events: blood not aspirated, injection not painful, no injection resistance, negative IV test and no paresthesia  Additional Notes Patient identified. Risks/Benefits/Options discussed with patient including but not limited to bleeding, infection, nerve damage, paralysis, failed block, incomplete pain control, headache, blood pressure changes, nausea, vomiting, reactions to medication both or allergic, itching and postpartum back pain. Confirmed with bedside nurse the patient's most recent platelet count. Confirmed with patient that they are not currently taking any anticoagulation, have any bleeding history or any family history of bleeding disorders. Patient expressed understanding and wished to proceed. All questions were answered. Sterile technique was used throughout the entire procedure. Please see nursing notes for vital signs. Test dose was given through epidural catheter and negative prior to continuing to dose epidural or start infusion. Warning signs of high block given to the patient including shortness of breath, tingling/numbness in hands, complete motor block, or any concerning symptoms with instructions to  call for help. Patient was given instructions on fall risk and not to get out of bed. All questions and concerns addressed with instructions to call with any issues or inadequate analgesia.

## 2014-10-28 NOTE — Progress Notes (Signed)
In to see patient--epidural just completed, patient getting comfortable. Family and partner at bedside.  FHR Category 1  UCs q 1-5 min, moderate to palpation. Pitocin on 2 mu/min No vaginal bleeding  Reviewed plan of care with patient and family, including continuing pitocin induction, recheck of cervix after epidural stabilized and patient comfortable, deferral of AROM at present due to velamentous insertion, close monitoring of FHR and maternal status.  Plan cervical recheck in approx 1 hour or prn.  Donnel Saxon, CNM 10/28/14 0730

## 2014-10-28 NOTE — Progress Notes (Signed)
  Subjective: Comfortable with epidural, but has been treated for hypotension with meds.  Vomited x 1, feeling better now.  Partner, Melissa, and patient's mother with her at present, and supportive.  Objective: BP 113/61 mmHg  Pulse 122  Temp(Src) 98 F (36.7 C) (Oral)  Resp 18  Ht 5\' 3"  (1.6 m)  Wt 92.534 kg (204 lb)  BMI 36.15 kg/m2  SpO2 99%  LMP 12/15/2013    Received 1st dose PCN 0746  FHT: Category 1 UC:   irregular, every 1-4 minutes, mild SVE:   Cervix posterior, 3 cm, 70%, vtx, -2, small amount bloody show noted  Pitocin at 2 mu/min  Assessment:  IUP at 40 1/7 weeks Induction for velamentous insertion GBS positive Female partner  Plan: Continue current management with pitocin induction. Defer AROM at present. Close observation of FHR and maternal status.  Donnel Saxon CNM 10/28/2014, 8:44 AM

## 2014-10-28 NOTE — Anesthesia Preprocedure Evaluation (Signed)
Anesthesia Evaluation  Patient identified by MRN, date of birth, ID band Patient awake    Reviewed: Allergy & Precautions, NPO status , Patient's Chart, lab work & pertinent test results  History of Anesthesia Complications Negative for: history of anesthetic complications  Airway Mallampati: II  TM Distance: >3 FB Neck ROM: Full    Dental no notable dental hx. (+) Dental Advisory Given   Pulmonary neg pulmonary ROS,  breath sounds clear to auscultation  Pulmonary exam normal       Cardiovascular Exercise Tolerance: Good negative cardio ROS  Rhythm:Regular Rate:Normal     Neuro/Psych negative neurological ROS  negative psych ROS   GI/Hepatic negative GI ROS, Neg liver ROS,   Endo/Other  obesity  Renal/GU negative Renal ROS  negative genitourinary   Musculoskeletal negative musculoskeletal ROS (+)   Abdominal   Peds negative pediatric ROS (+)  Hematology negative hematology ROS (+)   Anesthesia Other Findings   Reproductive/Obstetrics (+) Pregnancy IOL for velamentous cord                             Anesthesia Physical Anesthesia Plan  ASA: II  Anesthesia Plan: Epidural   Post-op Pain Management:    Induction:   Airway Management Planned:   Additional Equipment:   Intra-op Plan:   Post-operative Plan:   Informed Consent:   Dental advisory given  Plan Discussed with:   Anesthesia Plan Comments:         Anesthesia Quick Evaluation

## 2014-10-29 LAB — CBC
HEMATOCRIT: 30.1 % — AB (ref 36.0–46.0)
Hemoglobin: 9.8 g/dL — ABNORMAL LOW (ref 12.0–15.0)
MCH: 27 pg (ref 26.0–34.0)
MCHC: 32.6 g/dL (ref 30.0–36.0)
MCV: 82.9 fL (ref 78.0–100.0)
Platelets: 231 10*3/uL (ref 150–400)
RBC: 3.63 MIL/uL — AB (ref 3.87–5.11)
RDW: 14.8 % (ref 11.5–15.5)
WBC: 10.4 10*3/uL (ref 4.0–10.5)

## 2014-10-29 MED ORDER — IBUPROFEN 600 MG PO TABS: 600.0000 mg | ORAL_TABLET | Freq: Four times a day (QID) | ORAL | Status: DC

## 2014-10-29 MED ORDER — FERROUS SULFATE 325 (65 FE) MG PO TBEC: 325.0000 mg | DELAYED_RELEASE_TABLET | Freq: Two times a day (BID) | ORAL | Status: DC

## 2014-10-29 NOTE — Anesthesia Postprocedure Evaluation (Signed)
Anesthesia Post Note  Patient: Jocelyn Lucas  Procedure(s) Performed: * No procedures listed *  Anesthesia type: Epidural  Patient location: Mother/Baby  Post pain: Pain level controlled  Post assessment: Post-op Vital signs reviewed  Last Vitals:  Filed Vitals:   10/29/14 0639  BP: 113/67  Pulse: 90  Temp: 36.9 C  Resp: 20    Post vital signs: Reviewed  Level of consciousness:alert  Complications: No apparent anesthesia complications

## 2014-10-29 NOTE — Lactation Note (Signed)
This note was copied from the chart of Jocelyn Adanna Zuckerman. Lactation Consultation Note  Patient Name: Jocelyn Lucas [id number] Date: 10/29/2014 Reason for consult: Initial assessment Baby 18 hours of life. There is a discharge order for baby. Mom states that she has experience BF and nursed first child without any issues. Enc mom to nurse with cues and offer lots of STS. Mom given [facility] brochure, aware of OP/BFSG, community resources, and [facility] phone line assistance after D/C. Enc mom to call for assistance as needed.   Maternal Data Has patient been taught Hand Expression?: Yes Does the patient have breastfeeding experience prior to this delivery?: Yes  Feeding    LATCH Score/Interventions                      Lactation Tools Discussed/Used     Consult Status Consult Status: Complete    Inocente Salles 10/29/2014, 9:45 AM

## 2014-10-29 NOTE — Discharge Summary (Signed)
Vaginal Delivery Discharge Summary  ALL information will be verified prior to discharge  Jocelyn Lucas  DOB:    Sep 04, 1985 MRN:    [id number] CSN:    [id number]  Date of admission:                  10/27/14  Date of discharge:                   10/29/14  Procedures this admission: SVD  Date of Delivery: 10/28/14  Newborn Data:  Live born  Information for the patient's newborn:  Keirstin, Musil [[id number]]  female   Live born female  Birth Weight: 7 lb 10.4 oz (3470 g) APGAR: 9, 9  Home with mother. Name: Ovid Curd    History of Present Illness: Jocelyn Lucas is a 29 y.o. female, [phone], who presents at [redacted]w[redacted]d weeks gestation. The patient has been followed at the [facility] of [facility]. She was admitted induction of labor. Her pregnancy has been complicated by:  Patient Active Problem List   Diagnosis Date Noted  . Vaginal delivery 10/28/2014  . Positive GBS test 10/28/2014  . Velamentous insertion of umbilical cord 51/08/5850    Hospital course: The patient was admitted for IOL.   Her labor was not complicated. She proceeded to have a vaginal delivery of a healthy infant. Her delivery was not complicated. Her postpartum course was not complicated. She was discharged to home on postpartum day 2 doing well.  Feeding: breast  Contraception: no method  Discharge hemoglobin: HEMOGLOBIN  Date Value Ref Range Status  10/29/2014 9.8* 12.0 - 15.0 g/dL Final   HCT  Date Value Ref Range Status  10/29/2014 30.1* 36.0 - 46.0 % Final    PreNatal Labs ABO, Rh: --/--/AB POS, AB POS (04/26 1940)   Antibody: NEG (04/26 1940) Rubella:    immune RPR: Non Reactive (04/26 1940)  HBsAg: Negative (10/08 0000)  HIV: Non-reactive (10/08 0000)  GBS: Positive (04/04 0000)  Discharge Physical Exam:  General: alert and cooperative Lochia: appropriate Uterine Fundus: firm Incision: healing well DVT Evaluation: No  evidence of DVT seen on physical exam.  Intrapartum Procedures: spontaneous vaginal delivery Postpartum Procedures: antibiotics Complications-Operative and Postpartum: none  Discharge Diagnoses: Term Pregnancy-delivered,  asymptomatic anemia  Activity:           pelvic rest Diet:                routine Medications: PNV, Ibuprofen and Iron Condition:      stable     Postpartum Teaching: Nutrition, exercise, return to work or school, family visits, sexual activity, home rest, vaginal bleeding, pelvic rest, family planning, s/s of PPD, breast care peri-care and incision care   Discharge to: home  Follow-up Information    Follow up with [facility]. Schedule an appointment as soon as possible for a visit in 6 weeks.   Specialty:  Obstetrics and Gynecology   Why:  Postpartum check up   Contact information:   [address] [place] [address] [phone]       Ebonee Stober, CNM, MSN 10/29/2014. 4:49 PM  All information will be verified prior to discharge   Postpartum Care After Vaginal Delivery  After you deliver your newborn (postpartum period), the usual stay in the hospital is 24 72 hours. If there were problems with your labor or delivery, or if you have other medical problems, you might  be in the hospital longer.  While you are in the hospital, you will receive help and instructions on how to care for yourself and your newborn during the postpartum period.  While you are in the hospital:  Be sure to tell your nurses if you have pain or discomfort, as well as where you feel the pain and what makes the pain worse.  If you had an incision made near your vagina (episiotomy) or if you had some tearing during delivery, the nurses may put ice packs on your episiotomy or tear. The ice packs may help to reduce the pain and swelling.  If you are breastfeeding, you may feel uncomfortable contractions of your uterus  for a couple of weeks. This is normal. The contractions help your uterus get back to normal size.  It is normal to have some bleeding after delivery.  For the first 1 3 days after delivery, the flow is red and the amount may be similar to a period.  It is common for the flow to start and stop.  In the first few days, you may pass some small clots. Let your nurses know if you begin to pass large clots or your flow increases.  Do not  flush blood clots down the toilet before having the nurse look at them.  During the next 3 10 days after delivery, your flow should become more watery and pink or brown-tinged in color.  Ten to fourteen days after delivery, your flow should be a small amount of yellowish-white discharge.  The amount of your flow will decrease over the first few weeks after delivery. Your flow may stop in 6 8 weeks. Most women have had their flow stop by 12 weeks after delivery.  You should change your sanitary pads frequently.  Wash your hands thoroughly with soap and water for at least 20 seconds after changing pads, using the toilet, or before holding or feeding your newborn.  You should feel like you need to empty your bladder within the first 6 8 hours after delivery.  In case you become weak, lightheaded, or faint, call your nurse before you get out of bed for the first time and before you take a shower for the first time.  Within the first few days after delivery, your breasts may begin to feel tender and full. This is called engorgement. Breast tenderness usually goes away within 48 72 hours after engorgement occurs. You may also notice milk leaking from your breasts. If you are not breastfeeding, do not stimulate your breasts. Breast stimulation can make your breasts produce more milk.  Spending as much time as possible with your newborn is very important. During this time, you and your newborn can feel close and get to know each other. Having your newborn stay in your  room (rooming in) will help to strengthen the bond with your newborn. It will give you time to get to know your newborn and become comfortable caring for your newborn.  Your hormones change after delivery. Sometimes the hormone changes can temporarily cause you to feel sad or tearful. These feelings should not last more than a few days. If these feelings last longer than that, you should talk to your caregiver.  If desired, talk to your caregiver about methods of family planning or contraception.  Talk to your caregiver about immunizations. Your caregiver may want you to have the following immunizations before leaving the hospital:  Tetanus, diphtheria, and pertussis (Tdap) or tetanus and diphtheria (Td) immunization.  It is very important that you and your family (including grandparents) or others caring for your newborn are up-to-date with the Tdap or Td immunizations. The Tdap or Td immunization can help protect your newborn from getting ill.  Rubella immunization.  Varicella (chickenpox) immunization.  Influenza immunization. You should receive this annual immunization if you did not receive the immunization during your pregnancy. Document Released: 04/16/2007 Document Revised: 03/13/2012 Document Reviewed: 02/14/2012 [facility] Patient Information 2014 [place].   Postpartum Depression and Baby Blues  The postpartum period begins right after the birth of a baby. During this time, there is often a great amount of joy and excitement. It is also a time of considerable changes in the life of the parent(s). Regardless of how many times a mother gives birth, each child brings new challenges and dynamics to the family. It is not unusual to have feelings of excitement accompanied by confusing shifts in moods, emotions, and thoughts. All mothers are at risk of developing postpartum depression or the "baby blues." These mood changes can occur right after giving birth, or they may occur many  months after giving birth. The baby blues or postpartum depression can be mild or severe. Additionally, postpartum depression can resolve rather quickly, or it can be a long-term condition. CAUSES Elevated hormones and their rapid decline are thought to be a main cause of postpartum depression and the baby blues. There are a number of hormones that radically change during and after pregnancy. Estrogen and progesterone usually decrease immediately after delivering your baby. The level of thyroid hormone and various cortisol steroids also rapidly drop. Other factors that play a major role in these changes include major life events and genetics.  RISK FACTORS If you have any of the following risks for the baby blues or postpartum depression, know what symptoms to watch out for during the postpartum period. Risk factors that may increase the likelihood of getting the baby blues or postpartum depression include: 1. Havinga personal or family history of depression. 2. Having depression while being pregnant. 3. Having premenstrual or oral contraceptive-associated mood issues. 4. Having exceptional life stress. 5. Having marital conflict. 6. Lacking a social support network. 7. Having a baby with special needs. 8. Having health problems such as diabetes. SYMPTOMS Baby blues symptoms include:  Brief fluctuations in mood, such as going from extreme happiness to sadness.  Decreased concentration.  Difficulty sleeping.  Crying spells, tearfulness.  Irritability.  Anxiety. Postpartum depression symptoms typically begin within the first month after giving birth. These symptoms include:  Difficulty sleeping or excessive sleepiness.  Marked weight loss.  Agitation.  Feelings of worthlessness.  Lack of interest in activity or food. Postpartum psychosis is a very concerning condition and can be dangerous. Fortunately, it is rare. Displaying any of the following symptoms is cause for immediate  medical attention. Postpartum psychosis symptoms include:  Hallucinations and delusions.  Bizarre or disorganized behavior.  Confusion or disorientation. DIAGNOSIS  A diagnosis is made by an evaluation of your symptoms. There are no medical or lab tests that lead to a diagnosis, but there are various questionnaires that a caregiver may use to identify those with the baby blues, postpartum depression, or psychosis. Often times, a screening tool called the [place] Postnatal Depression Scale is used to diagnose depression in the postpartum period.  TREATMENT The baby blues usually goes away on its own in 1 to 2 weeks. Social support is often all that is needed. You should be encouraged to get adequate sleep  and rest. Occasionally, you may be given medicines to help you sleep.  Postpartum depression requires treatment as it can last several months or longer if it is not treated. Treatment may include individual or group therapy, medicine, or both to address any social, physiological, and psychological factors that may play a role in the depression. Regular exercise, a healthy diet, rest, and social support may also be strongly recommended.  Postpartum psychosis is more serious and needs treatment right away. Hospitalization is often needed. HOME CARE INSTRUCTIONS  Get as much rest as you can. Nap when the baby sleeps.  Exercise regularly. Some women find yoga and walking to be beneficial.  Eat a balanced and nourishing diet.  Do little things that you enjoy. Have a cup of tea, take a bubble bath, read your favorite magazine, or listen to your favorite music.  Avoid alcohol.  Ask for help with household chores, cooking, grocery shopping, or running errands as needed. Do not try to do everything.  Talk to people close to you about how you are feeling. Get support from your partner, family members, friends, or other new moms.  Try to stay positive in how you think. Think about the things you  are grateful for.  Do not spend a lot of time alone.  Only take medicine as directed by your caregiver.  Keep all your postpartum appointments.  Let your caregiver know if you have any concerns. SEEK MEDICAL CARE IF: You are having a reaction or problems with your medicine. SEEK IMMEDIATE MEDICAL CARE IF:  You have suicidal feelings.  You feel you may harm the baby or someone else. Document Released: 03/23/2004 Document Revised: 09/11/2011 Document Reviewed: 04/25/2011  [facility] Patient Information 2014 [place], [place].     Breastfeeding Deciding to breastfeed is one of the best choices you can make for you and your baby. A change in hormones during pregnancy causes your breast tissue to grow and increases the number and size of your milk ducts. These hormones also allow proteins, sugars, and fats from your blood supply to make breast milk in your milk-producing glands. Hormones prevent breast milk from being released before your baby is born as well as prompt milk flow after birth. Once breastfeeding has begun, thoughts of your baby, as well as his or her sucking or crying, can stimulate the release of milk from your milk-producing glands.  BENEFITS OF BREASTFEEDING For Your Baby  Your first milk (colostrum) helps your baby's digestive system function better.   There are antibodies in your milk that help your baby fight off infections.   Your baby has a lower incidence of asthma, allergies, and sudden infant death syndrome.   The nutrients in breast milk are better for your baby than infant formulas and are designed uniquely for your baby's needs.   Breast milk improves your baby's brain development.   Your baby is less likely to develop other conditions, such as childhood obesity, asthma, or type 2 diabetes mellitus.  For You   Breastfeeding helps to create a very special bond between you and your baby.   Breastfeeding is convenient. Breast milk is always available  at the correct temperature and costs nothing.   Breastfeeding helps to burn calories and helps you lose the weight gained during pregnancy.   Breastfeeding makes your uterus contract to its prepregnancy size faster and slows bleeding (lochia) after you give birth.   Breastfeeding helps to lower your risk of developing type 2 diabetes mellitus, osteoporosis, and breast or  ovarian cancer later in life. SIGNS THAT YOUR BABY IS HUNGRY Early Signs of Hunger  Increased alertness or activity.  Stretching.  Movement of the head from side to side.  Movement of the head and opening of the mouth when the corner of the mouth or cheek is stroked (rooting).  Increased sucking sounds, smacking lips, cooing, sighing, or squeaking.  Hand-to-mouth movements.  Increased sucking of fingers or hands. Late Signs of Hunger  Fussing.  Intermittent crying. Extreme Signs of Hunger Signs of extreme hunger will require calming and consoling before your baby will be able to breastfeed successfully. Do not wait for the following signs of extreme hunger to occur before you initiate breastfeeding:   Restlessness.  A loud, strong cry.   Screaming.   BREASTFEEDING BASICS Breastfeeding Initiation  Find a comfortable place to sit or lie down, with your neck and back well supported.  Place a pillow or rolled up blanket under your baby to bring him or her to the level of your breast (if you are seated). Nursing pillows are specially designed to help support your arms and your baby while you breastfeed.  Make sure that your baby's abdomen is facing your abdomen.   Gently massage your breast. With your fingertips, massage from your chest wall toward your nipple in a circular motion. This encourages milk flow. You may need to continue this action during the feeding if your milk flows slowly.  Support your breast with 4 fingers underneath and your thumb above your nipple. Make sure your fingers are well  away from your nipple and your baby's mouth.   Stroke your baby's lips gently with your finger or nipple.   When your baby's mouth is open wide enough, quickly bring your baby to your breast, placing your entire nipple and as much of the colored area around your nipple (areola) as possible into your baby's mouth.   More areola should be visible above your baby's upper lip than below the lower lip.   Your baby's tongue should be between his or her lower gum and your breast.   Ensure that your baby's mouth is correctly positioned around your nipple (latched). Your baby's lips should create a seal on your breast and be turned out (everted).  It is common for your baby to suck about 2-3 minutes in order to start the flow of breast milk. Latching Teaching your baby how to latch on to your breast properly is very important. An improper latch can cause nipple pain and decreased milk supply for you and poor weight gain in your baby. Also, if your baby is not latched onto your nipple properly, he or she may swallow some air during feeding. This can make your baby fussy. Burping your baby when you switch breasts during the feeding can help to get rid of the air. However, teaching your baby to latch on properly is still the best way to prevent fussiness from swallowing air while breastfeeding. Signs that your baby has successfully latched on to your nipple:    Silent tugging or silent sucking, without causing you pain.   Swallowing heard between every 3-4 sucks.    Muscle movement above and in front of his or her ears while sucking.  Signs that your baby has not successfully latched on to nipple:   Sucking sounds or smacking sounds from your baby while breastfeeding.  Nipple pain. If you think your baby has not latched on correctly, slip your finger into the corner of your  baby's mouth to break the suction and place it between your baby's gums. Attempt breastfeeding initiation again. Signs  of Successful Breastfeeding Signs from your baby:   A gradual decrease in the number of sucks or complete cessation of sucking.   Falling asleep.   Relaxation of his or her body.   Retention of a small amount of milk in his or her mouth.   Letting go of your breast by himself or herself. Signs from you:  Breasts that have increased in firmness, weight, and size 1-3 hours after feeding.   Breasts that are softer immediately after breastfeeding.  Increased milk volume, as well as a change in milk consistency and color by the fifth day of breastfeeding.   Nipples that are not sore, cracked, or bleeding. Signs That Your [employer]el Books is Getting Enough Milk  Wetting at least 3 diapers in a 24-hour period. The urine should be clear and pale yellow by age 23 days.  At least 3 stools in a 24-hour period by age 23 days. The stool should be soft and yellow.  At least 3 stools in a 24-hour period by age 10 days. The stool should be seedy and yellow.  No loss of weight greater than 10% of birth weight during the first 38 days of age.  Average weight gain of 4-7 ounces (113-198 g) per week after age 62 days.  Consistent daily weight gain by age 83 days, without weight loss after the age of 2 weeks. After a feeding, your baby may spit up a small amount. This is common. BREASTFEEDING FREQUENCY AND DURATION Frequent feeding will help you make more milk and can prevent sore nipples and breast engorgement. Breastfeed when you feel the need to reduce the fullness of your breasts or when your baby shows signs of hunger. This is called "breastfeeding on demand." Avoid introducing a pacifier to your baby while you are working to establish breastfeeding (the first 4-6 weeks after your baby is born). After this time you may choose to use a pacifier. Research has shown that pacifier use during the first year of a baby's life decreases the risk of sudden infant death syndrome (SIDS). Allow your baby to feed on  each breast as long as he or she wants. Breastfeed until your baby is finished feeding. When your baby unlatches or falls asleep while feeding from the first breast, offer the second breast. Because newborns are often sleepy in the first few weeks of life, you may need to awaken your baby to get him or her to feed. Breastfeeding times will vary from baby to baby. However, the following rules can serve as a guide to help you ensure that your baby is properly fed:  Newborns (babies 25 weeks of age or younger) may breastfeed every 1-3 hours.  Newborns should not go longer than 3 hours during the day or 5 hours during the night without breastfeeding.  You should breastfeed your baby a minimum of 8 times in a 24-hour period until you begin to introduce solid foods to your baby at around 33 months of age. BREAST MILK PUMPING Pumping and storing breast milk allows you to ensure that your baby is exclusively fed your breast milk, even at times when you are unable to breastfeed. This is especially important if you are going back to work while you are still breastfeeding or when you are not able to be present during feedings. Your lactation consultant can give you guidelines on how long it is safe  to store breast milk.  A breast pump is a machine that allows you to pump milk from your breast into a sterile bottle. The pumped breast milk can then be stored in a refrigerator or freezer. Some breast pumps are operated by hand, while others use electricity. Ask your lactation consultant which type will work best for you. Breast pumps can be purchased, but some hospitals and breastfeeding support groups lease breast pumps on a monthly basis. A lactation consultant can teach you how to hand express breast milk, if you prefer not to use a pump.  CARING FOR YOUR BREASTS WHILE YOU BREASTFEED Nipples can become dry, cracked, and sore while breastfeeding. The following recommendations can help keep your breasts moisturized and  healthy:  Avoid using soap on your nipples.   Wear a supportive bra. Although not required, special nursing bras and tank tops are designed to allow access to your breasts for breastfeeding without taking off your entire bra or top. Avoid wearing underwire-style bras or extremely tight bras.  Air dry your nipples for 3-54minutes after each feeding.   Use only cotton bra pads to absorb leaked breast milk. Leaking of breast milk between feedings is normal.   Use lanolin on your nipples after breastfeeding. Lanolin helps to maintain your skin's normal moisture barrier. If you use pure lanolin, you do not need to wash it off before feeding your baby again. Pure lanolin is not toxic to your baby. You may also hand express a few drops of breast milk and gently massage that milk into your nipples and allow the milk to air dry. In the first few weeks after giving birth, some women experience extremely full breasts (engorgement). Engorgement can make your breasts feel heavy, warm, and tender to the touch. Engorgement peaks within 3-5 days after you give birth. The following recommendations can help ease engorgement:  Completely empty your breasts while breastfeeding or pumping. You may want to start by applying warm, moist heat (in the shower or with warm water-soaked hand towels) just before feeding or pumping. This increases circulation and helps the milk flow. If your baby does not completely empty your breasts while breastfeeding, pump any extra milk after he or she is finished.  Wear a snug bra (nursing or regular) or tank top for 1-2 days to signal your body to slightly decrease milk production.  Apply ice packs to your breasts, unless this is too uncomfortable for you.  Make sure that your baby is latched on and positioned properly while breastfeeding. If engorgement persists after 48 hours of following these recommendations, contact your health care provider or a Science writer. OVERALL  HEALTH CARE RECOMMENDATIONS WHILE BREASTFEEDING  Eat healthy foods. Alternate between meals and snacks, eating 3 of each per day. Because what you eat affects your breast milk, some of the foods may make your baby more irritable than usual. Avoid eating these foods if you are sure that they are negatively affecting your baby.  Drink milk, fruit juice, and water to satisfy your thirst (about 10 glasses a day).   Rest often, relax, and continue to take your prenatal vitamins to prevent fatigue, stress, and anemia.  Continue breast self-awareness checks.  Avoid chewing and smoking tobacco.  Avoid alcohol and drug use. Some medicines that may be harmful to your baby can pass through breast milk. It is important to ask your health care provider before taking any medicine, including all over-the-counter and prescription medicine as well as vitamin and herbal supplements. It  is possible to become pregnant while breastfeeding. If birth control is desired, ask your health care provider about options that will be safe for your baby. SEEK MEDICAL CARE IF:   You feel like you want to stop breastfeeding or have become frustrated with breastfeeding.  You have painful breasts or nipples.  Your nipples are cracked or bleeding.  Your breasts are red, tender, or warm.  You have a swollen area on either breast.  You have a fever or chills.  You have nausea or vomiting.  You have drainage other than breast milk from your nipples.  Your breasts do not become full before feedings by the fifth day after you give birth.  You feel sad and depressed.  Your baby is too sleepy to eat well.  Your baby is having trouble sleeping.   Your baby is wetting less than 3 diapers in a 24-hour period.  Your baby has less than 3 stools in a 24-hour period.  Your baby's skin or the white part of his or her eyes becomes yellow.   Your baby is not gaining weight by 38 days of age. SEEK IMMEDIATE MEDICAL CARE  IF:   Your baby is overly tired (lethargic) and does not want to wake up and feed.  Your baby develops an unexplained fever. Document Released: 06/19/2005 Document Revised: 06/24/2013 Document Reviewed: 12/11/2012 [facility] Patient Information 2015 [place], [place]. This information is not intended to replace advice given to you by your health care provider. Make sure you discuss any questions you have with your health care provider.

## 2014-10-29 NOTE — Progress Notes (Signed)
Jocelyn Lucas   Subjective: Post Partum Day 1 Vaginal delivery, no laceration Patient up ad lib, denies syncope or dizziness. Reports consuming regular diet without issues and denies N/V No issues with urination and reports bleeding is appropriate  Feeding:  breast Contraceptive plan:   none  Objective: Temp:  [97.8 F (36.6 C)-99.5 F (37.5 C)] 98.4 F (36.9 C) (04/28 0639) Pulse Rate:  [90-118] 90 (04/28 0639) Resp:  [18-20] 20 (04/28 0639) BP: (88-118)/(48-78) 113/67 mmHg (04/28 0639) SpO2:  [100 %] 100 % (04/28 5929)  Physical Exam:  General: alert and cooperative Ext: WNL, no edema. No evidence of DVT seen on physical exam. Breast: Soft filling Lungs: CTAB Heart RRR without murmur  Abdomen:  Soft, fundus firm, lochia scant, + bowel sounds, non distended, non tender Lochia: appropriate Uterine Fundus: firm Laceration: healing well    Recent Labs  10/28/14 1605 10/29/14 0540  HGB 9.8* 9.8*  HCT 29.7* 30.1*    Assessment S/P Vaginal Delivery-Day 1 Stable  Normal Involution Breastfeeding  Plan: Continue current care Plan for discharge tomorrow, Breastfeeding and Lactation consult Lactation support   Juliocesar Blasius, CNM, MSN 10/29/2014, 2:19 PM

## 2014-10-29 NOTE — Progress Notes (Signed)
CSW received consult since MOB has history of Asperger's Syndrome. CSW screening out consult after consulting with RN who reported that there are no concerns related to infant care at this time.   Contact CSW if needs arise or upon family request.

## 2015-03-30 ENCOUNTER — Encounter (HOSPITAL_COMMUNITY): Payer: Self-pay | Admitting: Emergency Medicine

## 2015-03-30 ENCOUNTER — Emergency Department (HOSPITAL_COMMUNITY)
Admission: EM | Admit: 2015-03-30 | Discharge: 2015-03-30 | Disposition: A | Discharge status: Home / Self Care | Payer: Medicaid Other | Attending: Emergency Medicine | Admitting: Emergency Medicine

## 2015-03-30 DIAGNOSIS — L089 Local infection of the skin and subcutaneous tissue, unspecified: Secondary | ICD-10-CM | POA: Insufficient documentation

## 2015-03-30 DIAGNOSIS — L723 Sebaceous cyst: Secondary | ICD-10-CM | POA: Insufficient documentation

## 2015-03-30 MED ORDER — LIDOCAINE-EPINEPHRINE (PF) 2 %-1:200000 IJ SOLN
20.0000 mL | Freq: Once | INTRAMUSCULAR | Status: DC
  Filled 2015-03-30: qty 20

## 2015-03-30 NOTE — ED Notes (Signed)
Pt c/o abscess on upper right back area x2weeks.  Reports redness and pain to area.  Denies drainage.  Reports 7/10.

## 2015-03-30 NOTE — ED Provider Notes (Signed)
CSN: [id number]     Arrival date & time 03/30/15  2017 History  By signing my name below, I, Emmanuella Mensah, attest that this documentation has been prepared under the direction and in the presence of Charlann Lange, PA-C. Electronically Signed: Judithann Sauger, ED Scribe. 03/30/2015. 9:34 PM.    Chief Complaint  Patient presents with  . Abscess   The history is provided by the patient. No language interpreter was used.   HPI Comments: Jocelyn Lucas is a 29 y.o. female who presents to the Emergency Department complaining of a moderate gradually worsening draining painful abscess on her upper right back onset 2 weeks ago. She reports NKDA. She reports that she is currently breast-feeding. She states that she had an abscess on her right leg about 13 years ago.    Past Medical History  Diagnosis Date  . Medical history non-contributory    Past Surgical History  Procedure Laterality Date  . No past surgeries     History reviewed. No pertinent family history. Social History  Substance Use Topics  . Smoking status: Never Smoker   . Smokeless tobacco: Never Used  . Alcohol Use: Yes     Comment: occ   OB History    Gravida Para Term Preterm AB TAB SAB Ectopic Multiple Living   4 3 3  1  1   0 2     Review of Systems  Constitutional: Negative for fever.  Skin:       Abscess      Allergies  Review of patient's allergies indicates no known allergies.  Home Medications   Prior to Admission medications   Medication Sig Start Date End Date Taking? Authorizing Provider  ferrous sulfate 325 (65 FE) MG EC tablet Take 1 tablet (325 mg total) by mouth 2 (two) times daily. 10/29/14 10/29/15  Venus Standard, CNM  ibuprofen (ADVIL,MOTRIN) 600 MG tablet Take 1 tablet (600 mg total) by mouth every 6 (six) hours. 10/29/14   Venus Standard, CNM  Prenatal Vit-Fe Fumarate-FA (PRENATAL VITAMIN PO) Take 1 tablet by mouth daily.    Historical Provider, MD   BP 132/60 mmHg  Pulse 86  Temp(Src)  98.2 F (36.8 C) (Oral)  Resp 18  Ht 5\' 2"  (1.575 m)  Wt 211 lb (95.709 kg)  BMI 38.58 kg/m2  SpO2 100%  LMP 03/23/2015 Physical Exam  Constitutional: She is oriented to person, place, and time. She appears well-developed and well-nourished.  HENT:  Head: Normocephalic and atraumatic.  Cardiovascular: Normal rate.   Pulmonary/Chest: Effort normal.  Abdominal: She exhibits no distension.  Neurological: She is alert and oriented to person, place, and time.  Skin: Skin is warm and dry.  Right upper back has an area of induration that is red and tender with central pustule. No active drainage.   Psychiatric: She has a normal mood and affect.  Nursing note and vitals reviewed.   ED Course  Procedures (including critical care time) DIAGNOSTIC STUDIES: Oxygen Saturation is 100% on RA, normal by my interpretation.    COORDINATION OF CARE: 9:37 PM- Pt advised of plan for treatment and pt agrees.    Labs Review Labs Reviewed - No data to display  Imaging Review No results found. Charlann Lange, PA-C has personally reviewed and evaluated these images and lab results as part of her medical decision-making.  INCISION AND DRAINAGE Performed by: Charlann Lange A Consent: Verbal consent obtained. Risks and benefits: risks, benefits and alternatives were discussed Type: abscess  Body area: upper right  back  Anesthesia: local infiltration  Incision was made with a scalpel.  Local anesthetic: lidocaine 2% w/o epinephrine  Anesthetic total: w ml  Complexity: complex Blunt dissection to break up loculations  Drainage: purulent  Drainage amount: large, mixed purulent and sebaceous  Packing material: none  Patient tolerance: Patient tolerated the procedure well with no immediate complications.     EKG Interpretation None      MDM   Final diagnoses:  None    1. Sebaceous cyst with infection  I&D per above note. The patient will take Tylenol for pain as she is  breastfeeding. She will follow up here with any worsening concerns.  I personally performed the services described in this documentation, which was scribed in my presence. The recorded information has been reviewed and is accurate.     Charlann Lange, PA-C 04/01/15 0410  Julianne Rice, MD 04/01/15 [phone]

## 2015-03-30 NOTE — Discharge Instructions (Signed)

## 2016-08-24 DIAGNOSIS — R05 Cough: Secondary | ICD-10-CM | POA: Diagnosis not present

## 2016-08-24 DIAGNOSIS — J189 Pneumonia, unspecified organism: Secondary | ICD-10-CM | POA: Diagnosis not present

## 2016-08-24 DIAGNOSIS — J984 Other disorders of lung: Secondary | ICD-10-CM | POA: Diagnosis not present

## 2016-08-24 DIAGNOSIS — Z79899 Other long term (current) drug therapy: Secondary | ICD-10-CM | POA: Diagnosis not present

## 2016-08-24 DIAGNOSIS — R070 Pain in throat: Secondary | ICD-10-CM | POA: Diagnosis not present

## 2016-08-24 DIAGNOSIS — M791 Myalgia: Secondary | ICD-10-CM | POA: Diagnosis not present

## 2016-09-04 DIAGNOSIS — Z01419 Encounter for gynecological examination (general) (routine) without abnormal findings: Secondary | ICD-10-CM | POA: Diagnosis not present

## 2016-09-04 DIAGNOSIS — N760 Acute vaginitis: Secondary | ICD-10-CM | POA: Diagnosis not present

## 2016-09-04 DIAGNOSIS — Z113 Encounter for screening for infections with a predominantly sexual mode of transmission: Secondary | ICD-10-CM | POA: Diagnosis not present

## 2016-09-04 DIAGNOSIS — Z6832 Body mass index (BMI) 32.0-32.9, adult: Secondary | ICD-10-CM | POA: Diagnosis not present

## 2016-09-18 DIAGNOSIS — F418 Other specified anxiety disorders: Secondary | ICD-10-CM | POA: Diagnosis not present

## 2016-09-18 DIAGNOSIS — Z0001 Encounter for general adult medical examination with abnormal findings: Secondary | ICD-10-CM | POA: Diagnosis not present

## 2016-09-18 DIAGNOSIS — A6000 Herpesviral infection of urogenital system, unspecified: Secondary | ICD-10-CM | POA: Diagnosis not present

## 2017-02-01 DIAGNOSIS — Z13220 Encounter for screening for lipoid disorders: Secondary | ICD-10-CM | POA: Diagnosis not present

## 2017-02-01 DIAGNOSIS — F418 Other specified anxiety disorders: Secondary | ICD-10-CM | POA: Diagnosis not present

## 2017-02-01 DIAGNOSIS — A6000 Herpesviral infection of urogenital system, unspecified: Secondary | ICD-10-CM | POA: Diagnosis not present

## 2017-02-01 DIAGNOSIS — Z79899 Other long term (current) drug therapy: Secondary | ICD-10-CM | POA: Diagnosis not present

## 2017-03-16 DIAGNOSIS — F418 Other specified anxiety disorders: Secondary | ICD-10-CM | POA: Diagnosis not present

## 2017-04-06 DIAGNOSIS — F418 Other specified anxiety disorders: Secondary | ICD-10-CM | POA: Diagnosis not present

## 2017-11-08 DIAGNOSIS — F418 Other specified anxiety disorders: Secondary | ICD-10-CM | POA: Diagnosis not present

## 2018-05-16 ENCOUNTER — Inpatient Hospital Stay (HOSPITAL_COMMUNITY)
Admission: AD | Admit: 2018-05-16 | Discharge: 2018-05-16 | Discharge status: Against Medical Advice | Payer: BLUE CROSS/BLUE SHIELD | Source: Ambulatory Visit | Attending: Obstetrics and Gynecology | Admitting: Obstetrics and Gynecology

## 2018-05-16 NOTE — MAU Note (Signed)
Pt stated to Admitting secretary  she leaving to go to another hospital since we "don't have a sense of urgency " Pt refused to sign AMA form.

## 2018-05-18 ENCOUNTER — Encounter (HOSPITAL_COMMUNITY): Payer: Self-pay

## 2018-05-18 ENCOUNTER — Inpatient Hospital Stay (HOSPITAL_COMMUNITY)
Admission: AD | Admit: 2018-05-18 | Discharge: 2018-05-19 | Disposition: A | Discharge status: Home / Self Care | Payer: BLUE CROSS/BLUE SHIELD | Attending: Obstetrics & Gynecology | Admitting: Obstetrics & Gynecology

## 2018-05-18 ENCOUNTER — Emergency Department (HOSPITAL_COMMUNITY): Payer: BLUE CROSS/BLUE SHIELD

## 2018-05-18 DIAGNOSIS — Z3A01 Less than 8 weeks gestation of pregnancy: Secondary | ICD-10-CM | POA: Insufficient documentation

## 2018-05-18 DIAGNOSIS — N939 Abnormal uterine and vaginal bleeding, unspecified: Secondary | ICD-10-CM | POA: Diagnosis not present

## 2018-05-18 DIAGNOSIS — O26899 Other specified pregnancy related conditions, unspecified trimester: Secondary | ICD-10-CM

## 2018-05-18 DIAGNOSIS — R109 Unspecified abdominal pain: Secondary | ICD-10-CM | POA: Diagnosis not present

## 2018-05-18 DIAGNOSIS — O26891 Other specified pregnancy related conditions, first trimester: Secondary | ICD-10-CM | POA: Insufficient documentation

## 2018-05-18 DIAGNOSIS — O039 Complete or unspecified spontaneous abortion without complication: Secondary | ICD-10-CM | POA: Diagnosis not present

## 2018-05-18 DIAGNOSIS — O3411 Maternal care for benign tumor of corpus uteri, first trimester: Secondary | ICD-10-CM | POA: Diagnosis not present

## 2018-05-18 DIAGNOSIS — R1033 Periumbilical pain: Secondary | ICD-10-CM | POA: Diagnosis not present

## 2018-05-18 LAB — HCG, QUANTITATIVE, PREGNANCY: hCG, Beta Chain, Quant, S: 363 m[IU]/mL — ABNORMAL HIGH

## 2018-05-18 LAB — CBC WITH DIFFERENTIAL/PLATELET
Abs Immature Granulocytes: 0 K/uL (ref 0.00–0.07)
Basophils Absolute: 0 K/uL (ref 0.0–0.1)
Basophils Relative: 0 %
Eosinophils Absolute: 0.1 K/uL (ref 0.0–0.5)
Eosinophils Relative: 2 %
HCT: 34.9 % — ABNORMAL LOW (ref 36.0–46.0)
Hemoglobin: 10.9 g/dL — ABNORMAL LOW (ref 12.0–15.0)
Immature Granulocytes: 0 %
Lymphocytes Relative: 32 %
Lymphs Abs: 1.8 K/uL (ref 0.7–4.0)
MCH: 26.8 pg (ref 26.0–34.0)
MCHC: 31.2 g/dL (ref 30.0–36.0)
MCV: 85.7 fL (ref 80.0–100.0)
Monocytes Absolute: 0.5 K/uL (ref 0.1–1.0)
Monocytes Relative: 10 %
Neutro Abs: 3.2 K/uL (ref 1.7–7.7)
Neutrophils Relative %: 56 %
Platelets: 317 K/uL (ref 150–400)
RBC: 4.07 MIL/uL (ref 3.87–5.11)
RDW: 13.7 % (ref 11.5–15.5)
WBC: 5.7 K/uL (ref 4.0–10.5)
nRBC: 0 % (ref 0.0–0.2)

## 2018-05-18 LAB — COMPREHENSIVE METABOLIC PANEL
ALBUMIN: 3.3 g/dL — AB (ref 3.5–5.0)
ALT: 11 U/L (ref 0–44)
AST: 18 U/L (ref 15–41)
Alkaline Phosphatase: 39 U/L (ref 38–126)
Anion gap: 8 (ref 5–15)
BUN: 12 mg/dL (ref 6–20)
CO2: 22 mmol/L (ref 22–32)
Calcium: 8.6 mg/dL — ABNORMAL LOW (ref 8.9–10.3)
Chloride: 103 mmol/L (ref 98–111)
Creatinine, Ser: 0.93 mg/dL (ref 0.44–1.00)
GFR calc Af Amer: >60 mL/min (ref >60)
GLUCOSE: 98 mg/dL (ref 70–99)
POTASSIUM: 3.1 mmol/L — AB (ref 3.5–5.1)
Sodium: 133 mmol/L — ABNORMAL LOW (ref 135–145)
Total Bilirubin: 0.5 mg/dL (ref 0.3–1.2)
Total Protein: 6.2 g/dL — ABNORMAL LOW (ref 6.5–8.1)

## 2018-05-18 LAB — TYPE AND SCREEN
ABO/RH(D): AB POS
Antibody Screen: NEGATIVE

## 2018-05-18 NOTE — ED Provider Notes (Signed)
[facility] EMERGENCY DEPARTMENT Provider Note   CSN: [id number] Arrival date & time: 05/18/18  2032     History   Chief Complaint No chief complaint on file.   HPI Paetyn Pietrzak is a 32 y.o. female.  HPI Patient is a 32 year old female with no significant past medical history who presents to the emergency department for evaluation of a possible miscarriage.  Patient reports that her last menstrual period was approximately 8 weeks ago and she has had a positive pregnancy test at home.  She reports that earlier this week she began having a small amount of vaginal bleeding.  As a result patient was evaluated on Thursday at which time she was told her results were reassuring and told to follow-up with her OB/GYN on Monday.  However patient had worsening of her vaginal bleeding this afternoon as well as the development of lower abdominal pain that radiated to her back, and as a result patient came to the emergency department for further evaluation.  Upon arrival, patient is in no acute distress.  States she continues to have lower abdominal pain that radiates to her back.  Describes the pain as dull and achy in nature, nonradiating, and intermittent.  She also reports that she has had a continued small amount of vaginal bleeding, but not enough to soak through a pad.  She denies any other symptoms at this time.  Remaining review of systems as below.  Past Medical History:  Diagnosis Date  . Medical history non-contributory     Patient Active Problem List   Diagnosis Date Noted  . Vaginal delivery 10/28/2014  . Positive GBS test 10/28/2014  . 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 NO PAST SURGERIES       OB History    Gravida  5   Para  3   Term  3   Preterm      AB  1   Living  2     SAB  1   TAB      Ectopic      Multiple  0   Live Births  2            Home  Medications    Prior to Admission medications   Medication Sig Start Date End Date Taking? Authorizing Provider  ferrous sulfate 325 (65 FE) MG EC tablet Take 1 tablet (325 mg total) by mouth 2 (two) times daily. Patient not taking: Reported on 03/30/2015 10/29/14 10/29/15  Standard, Venus, CNM  ibuprofen (ADVIL,MOTRIN) 600 MG tablet Take 1 tablet (600 mg total) by mouth every 6 (six) hours. 10/29/14   Standard, Venus, CNM    Family History History reviewed. No pertinent family history.  Social History Social History   Tobacco Use  . Smoking status: Never Smoker  . Smokeless tobacco: Never Used  Substance Use Topics  . Alcohol use: Yes    Comment: occ  . Drug use: No     Allergies   Patient has no known allergies.   Review of Systems Review of Systems  Constitutional: Negative for chills and fever.  HENT: Negative for ear pain and sore throat.   Eyes: Negative for pain and visual disturbance.  Respiratory: Negative for cough and shortness of breath.   Cardiovascular: Negative for chest pain and palpitations.  Gastrointestinal: Positive for abdominal pain. Negative for vomiting.  Genitourinary: Positive for vaginal bleeding. Negative for dysuria and  hematuria.  Musculoskeletal: Positive for back pain. Negative for arthralgias.  Skin: Negative for color change and rash.  Neurological: Negative for seizures and syncope.  All other systems reviewed and are negative.    Physical Exam Updated Vital Signs BP (!) 105/58 (BP Location: Right Arm)   Pulse 92   Temp 98.9 F (37.2 C) (Oral)   Resp 16   Ht 5\' 3"  (1.6 m)   Wt 90.7 kg   LMP 03/03/2018 (Approximate)   SpO2 100%   BMI 35.43 kg/m   Physical Exam  Constitutional: She is oriented to person, place, and time. She appears well-developed and well-nourished. No distress.  HENT:  Head: Normocephalic and atraumatic.  Eyes: Conjunctivae are normal.  Neck: Neck supple.  Cardiovascular: Normal rate and regular rhythm.    Pulmonary/Chest: Effort normal. No respiratory distress.  Abdominal: Soft. There is tenderness (Mild suprapubic. ).  Genitourinary: There is no rash or lesion on the right labia. There is no rash or lesion on the left labia.  Genitourinary Comments: On pelvic exam patient has a significant amount of blood present in the vaginal vault.  After clearing out blood the cervical os was able to be visualized and there appears to be tissue protruding through a partially open cervical os.  There is no active bleeding at the time of pelvic exam.  Musculoskeletal: She exhibits no edema.  Neurological: She is alert and oriented to person, place, and time.  Skin: Skin is warm and dry.  Psychiatric: She has a normal mood and affect.  Nursing note and vitals reviewed.    ED Treatments / Results  Labs (all labs ordered are listed, but only abnormal results are displayed) Labs Reviewed  CBC WITH DIFFERENTIAL/PLATELET - Abnormal; Notable for the following components:      Result Value   Hemoglobin 10.9 (*)    HCT 34.9 (*)    All other components within normal limits  COMPREHENSIVE METABOLIC PANEL - Abnormal; Notable for the following components:   Sodium 133 (*)    Potassium 3.1 (*)    Calcium 8.6 (*)    Total Protein 6.2 (*)    Albumin 3.3 (*)    All other components within normal limits  HCG, QUANTITATIVE, PREGNANCY - Abnormal; Notable for the following components:   hCG, Beta Chain, Quant, S 363 (*)    All other components within normal limits  URINALYSIS, ROUTINE W REFLEX MICROSCOPIC  TYPE AND SCREEN    EKG None  Radiology US Ob Less Than 14 Weeks With Ob Transvaginal  Result Date: 05/18/2018 CLINICAL DATA:  Vaginal bleeding EXAM: OBSTETRIC <14 WK [place] AND TRANSVAGINAL OB US TECHNIQUE: Both transabdominal and transvaginal ultrasound examinations were performed for complete evaluation of the gestation as well as the maternal uterus, adnexal regions, and pelvic cul-de-sac. Transvaginal  technique was performed to assess early pregnancy. COMPARISON:  None. FINDINGS: Intrauterine gestational sac: Question small irregular gestational sac within the lower uterine segment Yolk sac:  Not visualized Embryo:  Not visualized Cardiac Activity: Not visualized Heart Rate:   bpm MSD: 6.0 mm   5 w   to d CRL:    mm    w    d                  [place] EDC: Subchorionic hemorrhage:  None visualized. Maternal uterus/adnexae: Small anterior fibroid measures 1.6 cm. Endometrium 9 mm in thickness. IMPRESSION: Possible irregular gestational sac in the lower uterine segment, 5 weeks 2 days by mean  sac diameter. Appearance and location concerning for possible spontaneous abortion in progress. This could be followed with serial quantitative beta HCGs and ultrasounds. Electronically Signed   By: Rolm Baptise M.D.   On: 05/18/2018 23:17    Procedures Procedures (including critical care time)  Medications Ordered in ED Medications - No data to display   Initial Impression / Assessment and Plan / ED Course  I have reviewed the triage vital signs and the nursing notes.  Pertinent labs & imaging results that were available during my care of the patient were reviewed by me and considered in my medical decision making (see chart for details).     Patient is a 32 year old female with a past medical history as detailed above who presents emergency department for evaluation of vaginal bleeding, lower abdominal pain, and back pain in the setting of early pregnancy.  Secondary to patient's arrival complaint multiple laboratory studies and a pelvic ultrasound was obtained.  Patient's labs show blood type of AB+ and an hCG of 363.  Patient's pelvic ultrasound shows a possible irregular gestational sac in the lower uterine segment with appearance and location concerning for possible spontaneous abortion in progress.  This is supported by findings on patient's pelvic exam.  Given patient's findings I spoke with the OB/GYN  on-call for [facility], Dr. Tressia Danas.  She states that patient may be transferred to [facility] hospital for further evaluation and possible assistance with her inevitable miscarriage, or if patient is comfortable with going home and passing the products of conception herself this would also be appropriate.  I spoke with the patient regarding these 2 options and she expresses desire to be transferred to [facility] for definitive management.  As a result patient will be transferred for further evaluation and care from our emergency department to [facility] hospital.  Patient was hemodynamically stable and in no acute distress prior to transfer.   The care of this patient was discussed with my attending physician Dr. Ashok Cordia, who voices agreement with work-up and ED disposition.  Final Clinical Impressions(s) / ED Diagnoses   Final diagnoses:  Inevitable complete miscarriage without complication    ED Discharge Orders    None       Oree Hislop, Chanda Busing, MD 05/19/18 6270    Lajean Saver, MD 05/19/18 1615

## 2018-05-18 NOTE — ED Triage Notes (Signed)
Pt. Reports being currently pregnant. Pt. Reports only taking a pregnancy test last month. Pt. Has upcoming appoint.  Pt. Reports spotting that started Thursday. Pt. Now reports feeling cramping and bright red blood that is noticeable when she goes to the [facility]. A/o x4

## 2018-05-19 ENCOUNTER — Encounter (HOSPITAL_COMMUNITY): Payer: Self-pay | Admitting: *Deleted

## 2018-05-19 DIAGNOSIS — O039 Complete or unspecified spontaneous abortion without complication: Secondary | ICD-10-CM | POA: Diagnosis not present

## 2018-05-19 DIAGNOSIS — N939 Abnormal uterine and vaginal bleeding, unspecified: Secondary | ICD-10-CM | POA: Diagnosis not present

## 2018-05-19 DIAGNOSIS — R109 Unspecified abdominal pain: Secondary | ICD-10-CM | POA: Diagnosis not present

## 2018-05-19 DIAGNOSIS — O26891 Other specified pregnancy related conditions, first trimester: Secondary | ICD-10-CM | POA: Diagnosis not present

## 2018-05-19 DIAGNOSIS — Z3A01 Less than 8 weeks gestation of pregnancy: Secondary | ICD-10-CM | POA: Diagnosis not present

## 2018-05-19 LAB — URINALYSIS, ROUTINE W REFLEX MICROSCOPIC
Bilirubin Urine: NEGATIVE
Glucose, UA: NEGATIVE mg/dL
Ketones, ur: NEGATIVE mg/dL
Leukocytes, UA: NEGATIVE
Nitrite: NEGATIVE
Protein, ur: NEGATIVE mg/dL
Specific Gravity, Urine: 1.025 (ref 1.005–1.030)
pH: 6 (ref 5.0–8.0)

## 2018-05-19 MED ORDER — OXYCODONE-ACETAMINOPHEN 5-325 MG PO TABS: 1.0000 | ORAL_TABLET | ORAL | 0 refills | Status: DC | PRN

## 2018-05-19 MED ORDER — IBUPROFEN 600 MG PO TABS: 600.0000 mg | ORAL_TABLET | Freq: Four times a day (QID) | ORAL | 0 refills | Status: DC

## 2018-05-19 NOTE — MAU Provider Note (Addendum)
History     CSN: [id number]  Arrival date and time: 05/19/18 3235   First Provider Initiated Contact with Patient 05/19/18 0258      No chief complaint on file.  HPI   Ms.Jocelyn Lucas is a 32 y.o. female [phone] @ Unknown here in MAU with complaints of vaginal bleeding. She presented to [facility] ER and had a full workup. Her OB was called by the ER she recommended the patient be transferred for further evaluation. She denies pain at this time however does have lower abdominal pain that waxes and wanes. 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 Says the bleeding is heavy like a menstrual cycle.   OB History    Gravida  5   Para  3   Term  3   Preterm      AB  1   Living  2     SAB  1   TAB      Ectopic      Multiple  0   Live Births  2           Past Medical History:  Diagnosis Date  . Medical history non-contributory     Past Surgical History:  Procedure Laterality Date  . NO PAST SURGERIES      Family History  Problem Relation Age of Onset  . ADD / ADHD Neg Hx   . Alcohol abuse Neg Hx   . Anxiety disorder Neg Hx   . Arthritis Neg Hx   . Asthma Neg Hx   . Birth defects Neg Hx   . Cancer Neg Hx   . COPD Neg Hx   . Depression Neg Hx   . Diabetes Neg Hx   . Drug abuse Neg Hx   . Early death Neg Hx   . Heart disease Neg Hx   . Hearing loss Neg Hx   . Hypertension Neg Hx   . Intellectual disability Neg Hx   . Kidney disease Neg Hx   . Learning disabilities Neg Hx   . Miscarriages / Stillbirths Neg Hx   . Obesity Neg Hx   . Stroke Neg Hx   . Vision loss Neg Hx   . Hyperlipidemia Neg Hx   . Varicose Veins Neg Hx     Social History   Tobacco Use  . Smoking status: Never Smoker  . Smokeless tobacco: Never Used  Substance Use Topics  . Alcohol use: Not Currently    Comment: occ  . Drug use: No    Allergies: No Known Allergies  Medications Prior to Admission  Medication Sig Dispense Refill  Last Dose  . ferrous sulfate 325 (65 FE) MG EC tablet Take 1 tablet (325 mg total) by mouth 2 (two) times daily. (Patient not taking: Reported on 05/19/2018) 60 tablet 3 Not Taking at Unknown time  . ibuprofen (ADVIL,MOTRIN) 600 MG tablet Take 1 tablet (600 mg total) by mouth every 6 (six) hours. (Patient not taking: Reported on 05/19/2018) 30 tablet 0 Completed Course at Unknown time   Results for orders placed or performed during the hospital encounter of 05/18/18 (from the past 48 hour(s))  CBC with Differential     Status: Abnormal   Collection Time: 05/18/18  9:08 PM  Result Value Ref Range   WBC 5.7 4.0 - 10.5 K/uL   RBC 4.07 3.87 - 5.11 MIL/uL   Hemoglobin 10.9 (L) 12.0 - 15.0 g/dL   HCT 34.9 (L) 36.0 -  46.0 %   MCV 85.7 80.0 - 100.0 fL   MCH 26.8 26.0 - 34.0 pg   MCHC 31.2 30.0 - 36.0 g/dL   RDW 13.7 11.5 - 15.5 %   Platelets 317 150 - 400 K/uL   nRBC 0.0 0.0 - 0.2 %   Neutrophils Relative % 56 %   Neutro Abs 3.2 1.7 - 7.7 K/uL   Lymphocytes Relative 32 %   Lymphs Abs 1.8 0.7 - 4.0 K/uL   Monocytes Relative 10 %   Monocytes Absolute 0.5 0.1 - 1.0 K/uL   Eosinophils Relative 2 %   Eosinophils Absolute 0.1 0.0 - 0.5 K/uL   Basophils Relative 0 %   Basophils Absolute 0.0 0.0 - 0.1 K/uL   Immature Granulocytes 0 %   Abs Immature Granulocytes 0.00 0.00 - 0.07 K/uL    Comment: Performed at [facility] [address], [place], [place] [address]  Comprehensive metabolic panel     Status: Abnormal   Collection Time: 05/18/18  9:08 PM  Result Value Ref Range   Sodium 133 (L) 135 - 145 mmol/L   Potassium 3.1 (L) 3.5 - 5.1 mmol/L   Chloride 103 98 - 111 mmol/L   CO2 22 22 - 32 mmol/L   Glucose, Bld 98 70 - 99 mg/dL   BUN 12 6 - 20 mg/dL   Creatinine, Ser 0.93 0.44 - 1.00 mg/dL   Calcium 8.6 (L) 8.9 - 10.3 mg/dL   Total Protein 6.2 (L) 6.5 - 8.1 g/dL   Albumin 3.3 (L) 3.5 - 5.0 g/dL   AST 18 15 - 41 U/L   ALT 11 0 - 44 U/L   Alkaline Phosphatase 39 38 - 126 U/L    Total Bilirubin 0.5 0.3 - 1.2 mg/dL   GFR calc non Af Amer >60 >60 mL/min   GFR calc Af Amer >60 >60 mL/min    Comment: (NOTE) The eGFR has been calculated using the CKD EPI equation. This calculation has not been validated in all clinical situations. eGFR's persistently <60 mL/min signify possible Chronic Kidney Disease.    Anion gap 8 5 - 15    Comment: Performed at [facility] [address], [place], [place] [address]  hCG, quantitative, pregnancy     Status: Abnormal   Collection Time: 05/18/18  9:08 PM  Result Value Ref Range   hCG, Beta Chain, Quant, S 363 (H) <5 mIU/mL    Comment:          GEST. AGE      CONC.  (mIU/mL)   <=1 WEEK        5 - 50     2 WEEKS       50 - 500     3 WEEKS       100 - 10,000     4 WEEKS     1,000 - 30,000     5 WEEKS     3,500 - 115,000   6-8 WEEKS     12,000 - 270,000    12 WEEKS     15,000 - 220,000        FEMALE AND NON-PREGNANT FEMALE:     LESS THAN 5 mIU/mL Performed at [facility], [place] [address], [place], [place] [address]   Type and screen [place]     Status: None   Collection Time: 05/18/18  9:25 PM  Result Value Ref Range   ABO/RH(D) AB POS  Antibody Screen NEG    Sample Expiration      05/21/2018 Performed at [facility], [place] [address], [place], [place] [address]   Urinalysis, Routine w reflex microscopic     Status: Abnormal   Collection Time: 05/18/18 11:41 PM  Result Value Ref Range   Color, Urine YELLOW YELLOW   APPearance CLEAR CLEAR   Specific Gravity, Urine 1.025 1.005 - 1.030   pH 6.0 5.0 - 8.0   Glucose, UA NEGATIVE NEGATIVE mg/dL   Hgb urine dipstick LARGE (A) NEGATIVE   Bilirubin Urine NEGATIVE NEGATIVE   Ketones, ur NEGATIVE NEGATIVE mg/dL   Protein, ur NEGATIVE NEGATIVE mg/dL   Nitrite NEGATIVE NEGATIVE   Leukocytes, UA NEGATIVE NEGATIVE   RBC / HPF 21-50 0 - 5 RBC/hpf   WBC, UA 0-5 0 - 5 WBC/hpf   Bacteria, UA RARE (A) NONE SEEN   Squamous Epithelial / LPF 0-5  0 - 5   Mucus PRESENT     Comment: Performed at [facility] [address], [place], [place] [address]   US Ob Less Than 14 Weeks With Ob Transvaginal  Result Date: 05/18/2018 CLINICAL DATA:  Vaginal bleeding EXAM: OBSTETRIC <14 WK [place] AND TRANSVAGINAL OB US TECHNIQUE: Both transabdominal and transvaginal ultrasound examinations were performed for complete evaluation of the gestation as well as the maternal uterus, adnexal regions, and pelvic cul-de-sac. Transvaginal technique was performed to assess early pregnancy. COMPARISON:  None. FINDINGS: Intrauterine gestational sac: Question small irregular gestational sac within the lower uterine segment Yolk sac:  Not visualized Embryo:  Not visualized Cardiac Activity: Not visualized Heart Rate:   bpm MSD: 6.0 mm   5 w   to d CRL:    mm    w    d                  [place] EDC: Subchorionic hemorrhage:  None visualized. Maternal uterus/adnexae: Small anterior fibroid measures 1.6 cm. Endometrium 9 mm in thickness. IMPRESSION: Possible irregular gestational sac in the lower uterine segment, 5 weeks 2 days by mean sac diameter. Appearance and location concerning for possible spontaneous abortion in progress. This could be followed with serial quantitative beta HCGs and ultrasounds. Electronically Signed   By: Rolm Baptise M.D.   On: 05/18/2018 23:17   Review of Systems  Constitutional: Negative for fever.  Gastrointestinal: Positive for abdominal pain.  Genitourinary: Positive for vaginal bleeding.  Neurological: Negative for dizziness and light-headedness.   Physical Exam   Blood pressure 106/61, pulse 84, temperature 98.6 F (37 C), temperature source Oral, resp. rate 17, height 5' 3" (1.6 m), weight 90.7 kg, last menstrual period 03/03/2018, SpO2 100 %, unknown if currently breastfeeding.  Physical Exam  Constitutional: She is oriented to person, place, and time. She appears well-developed and well-nourished. No distress.  HENT:  Head:  Normocephalic.  Eyes: Pupils are equal, round, and reactive to light.  GI: Soft. She exhibits no distension. There is no tenderness. There is no rebound.  Genitourinary:  Genitourinary Comments: Vagina - Small amount of dark red blood noted in vaginal canal  Cervix -tissue like, POC ? In the cervix, small amount removed with ring forceps  Bimanual exam: Cervix 1-2 cm Chaperone present for exam    Musculoskeletal: Normal range of motion.  Neurological: She is alert and oriented to person, place, and time.  Skin: Skin is warm. She is not diaphoretic.  Psychiatric: Her behavior is normal.   MAU Course  Procedures  None  MDM  Speculum exam shows gestational sac in the cervical os. Small amount removed with ring forceps and sent to pathology. Patient declines pain medication at this time AB positive blood type  Assessment and Plan   A:  1. Inevitable complete miscarriage without complication   2. Vaginal bleeding   3. Abdominal pain during pregnancy, antepartum     P:  Discharge home in stable condition Patient denies pain at this time Rx: ibuprofen, percocet. Call the office on Monday to schedule a f/u visit Dr. Helane Rima is aware of patient's arrival Message sent to [facility] in the office for f/u  Rasch, Artist Pais, NP  05/19/2018 1:07 PM

## 2018-05-19 NOTE — MAU Note (Signed)
Pt presents to MAU via [facility] from Jocelyn Lucas stating she is having a miscarriage. Pt states she started bleeding earlier today and it progressively worsened and she started having abdominal cramping so she went to be evaluated.   Pt now in MAU not complaining of pain and stating her bleeding has slowed down.

## 2018-05-19 NOTE — Discharge Instructions (Signed)
Threatened Miscarriage °A threatened miscarriage is when you have vaginal bleeding during your first 20 weeks of pregnancy but the pregnancy has not ended. Your doctor will do tests to make sure you are still pregnant. The cause of the bleeding may not be known. This condition does not mean your pregnancy will end. It does increase the risk of it ending (complete miscarriage). °Follow these instructions at home: °· Make sure you keep all your doctor visits for prenatal care. °· Get plenty of rest. °· Do not have sex or use tampons if you have vaginal bleeding. °· Do not douche. °· Do not smoke or use drugs. °· Do not drink alcohol. °· Avoid caffeine. °Contact a doctor if: °· You have light bleeding from your vagina. °· You have belly pain or cramping. °· You have a fever. °Get help right away if: °· You have heavy bleeding from your vagina. °· You have clots of blood coming from your vagina. °· You have bad pain or cramps in your low back or belly. °· You have fever, chills, and bad belly pain. °This information is not intended to replace advice given to you by your health care provider. Make sure you discuss any questions you have with your health care provider. °Document Released: 06/01/2008 Document Revised: 11/25/2015 Document Reviewed: 04/15/2013 °Elsevier Interactive Patient Education © 2018 [employer] Inc[employer] ° °

## 2018-05-20 DIAGNOSIS — O200 Threatened abortion: Secondary | ICD-10-CM | POA: Diagnosis not present

## 2018-05-29 DIAGNOSIS — O039 Complete or unspecified spontaneous abortion without complication: Secondary | ICD-10-CM | POA: Diagnosis not present

## 2018-09-11 DIAGNOSIS — F418 Other specified anxiety disorders: Secondary | ICD-10-CM | POA: Diagnosis not present

## 2018-09-26 DIAGNOSIS — Z1389 Encounter for screening for other disorder: Secondary | ICD-10-CM | POA: Diagnosis not present

## 2018-09-26 DIAGNOSIS — F418 Other specified anxiety disorders: Secondary | ICD-10-CM | POA: Diagnosis not present

## 2018-10-14 ENCOUNTER — Inpatient Hospital Stay (HOSPITAL_COMMUNITY)
Admission: AD | Admit: 2018-10-14 | Discharge: 2018-10-14 | Disposition: A | Discharge status: Home / Self Care | Payer: BLUE CROSS/BLUE SHIELD | Source: Ambulatory Visit | Attending: Obstetrics and Gynecology | Admitting: Obstetrics and Gynecology

## 2018-10-14 ENCOUNTER — Inpatient Hospital Stay (HOSPITAL_COMMUNITY): Payer: BLUE CROSS/BLUE SHIELD

## 2018-10-14 ENCOUNTER — Other Ambulatory Visit: Payer: Self-pay

## 2018-10-14 ENCOUNTER — Encounter (HOSPITAL_COMMUNITY): Payer: Self-pay | Admitting: *Deleted

## 2018-10-14 DIAGNOSIS — O9989 Other specified diseases and conditions complicating pregnancy, childbirth and the puerperium: Secondary | ICD-10-CM | POA: Insufficient documentation

## 2018-10-14 DIAGNOSIS — O418X10 Other specified disorders of amniotic fluid and membranes, first trimester, not applicable or unspecified: Secondary | ICD-10-CM

## 2018-10-14 DIAGNOSIS — Z3A08 8 weeks gestation of pregnancy: Secondary | ICD-10-CM | POA: Diagnosis not present

## 2018-10-14 DIAGNOSIS — O26891 Other specified pregnancy related conditions, first trimester: Secondary | ICD-10-CM

## 2018-10-14 DIAGNOSIS — R1030 Lower abdominal pain, unspecified: Secondary | ICD-10-CM | POA: Diagnosis not present

## 2018-10-14 DIAGNOSIS — O26899 Other specified pregnancy related conditions, unspecified trimester: Secondary | ICD-10-CM

## 2018-10-14 DIAGNOSIS — R109 Unspecified abdominal pain: Secondary | ICD-10-CM | POA: Insufficient documentation

## 2018-10-14 DIAGNOSIS — Z3A10 10 weeks gestation of pregnancy: Secondary | ICD-10-CM | POA: Insufficient documentation

## 2018-10-14 DIAGNOSIS — O468X1 Other antepartum hemorrhage, first trimester: Secondary | ICD-10-CM

## 2018-10-14 DIAGNOSIS — O208 Other hemorrhage in early pregnancy: Secondary | ICD-10-CM | POA: Insufficient documentation

## 2018-10-14 DIAGNOSIS — Z3A01 Less than 8 weeks gestation of pregnancy: Secondary | ICD-10-CM

## 2018-10-14 DIAGNOSIS — M549 Dorsalgia, unspecified: Secondary | ICD-10-CM | POA: Diagnosis not present

## 2018-10-14 DIAGNOSIS — Z3491 Encounter for supervision of normal pregnancy, unspecified, first trimester: Secondary | ICD-10-CM

## 2018-10-14 DIAGNOSIS — Z791 Long term (current) use of non-steroidal anti-inflammatories (NSAID): Secondary | ICD-10-CM | POA: Diagnosis not present

## 2018-10-14 HISTORY — DX: Anxiety disorder, unspecified: F41.9

## 2018-10-14 HISTORY — DX: Major depressive disorder, single episode, unspecified: F32.9

## 2018-10-14 LAB — URINALYSIS, ROUTINE W REFLEX MICROSCOPIC
Bilirubin Urine: NEGATIVE
Glucose, UA: NEGATIVE mg/dL
Hgb urine dipstick: NEGATIVE
Ketones, ur: NEGATIVE mg/dL
Leukocytes,Ua: NEGATIVE
Nitrite: NEGATIVE
Protein, ur: NEGATIVE mg/dL
Specific Gravity, Urine: 1.028 (ref 1.005–1.030)
pH: 5 (ref 5.0–8.0)

## 2018-10-14 LAB — CBC
HCT: 35.9 % — ABNORMAL LOW (ref 36.0–46.0)
Hemoglobin: 11.6 g/dL — ABNORMAL LOW (ref 12.0–15.0)
MCH: 27.2 pg (ref 26.0–34.0)
MCHC: 32.3 g/dL (ref 30.0–36.0)
MCV: 84.3 fL (ref 80.0–100.0)
Platelets: 282 10*3/uL (ref 150–400)
RBC: 4.26 MIL/uL (ref 3.87–5.11)
RDW: 13.5 % (ref 11.5–15.5)
WBC: 5.8 10*3/uL (ref 4.0–10.5)
nRBC: 0 % (ref 0.0–0.2)

## 2018-10-14 LAB — WET PREP, GENITAL
Sperm: NONE SEEN
Trich, Wet Prep: NONE SEEN

## 2018-10-14 LAB — POCT PREGNANCY, URINE: Preg Test, Ur: POSITIVE — AB

## 2018-10-14 LAB — HCG, QUANTITATIVE, PREGNANCY: hCG, Beta Chain, Quant, S: 25103 m[IU]/mL — ABNORMAL HIGH (ref <5)

## 2018-10-14 MED ORDER — TERCONAZOLE 0.8 % VA CREA: 1.0000 | TOPICAL_CREAM | Freq: Every day | VAGINAL | 0 refills | Status: DC

## 2018-10-14 MED ORDER — PREPLUS 27-1 MG PO TABS: 1.0000 | ORAL_TABLET | Freq: Every day | ORAL | 13 refills | Status: DC

## 2018-10-14 NOTE — MAU Provider Note (Signed)
History     CSN: [id number]  Arrival date and time: 10/14/18 1651   First Provider Initiated Contact with Patient 10/14/18 1721      Chief Complaint  Patient presents with  . Abdominal Pain  . Back Pain  . Possible Pregnancy   Jocelyn Lucas is a 33 y.o. G5P2 at [redacted]w[redacted]d by LMP who presents to MAU with complaints of abdominal and back pain. She reports having a +HPT last week. Abdominal pain and back pain started occurring 2 days ago, describes pain as sharp intermittent abdominal pain that occurs in her pelvis and radiates to her back, specific to her left side. Rates pain 7/10 when it occurs, has not taken any medication abdominal/back pain. She denies vaginal bleeding or vaginal discharge. She denies urinary symptoms. She plans on receiving prenatal care with [facility] as she has previously.    OB History    Gravida  5   Para  3   Term  3   Preterm      AB  1   Living  2     SAB  1   TAB      Ectopic      Multiple  0   Live Births  2           Past Medical History:  Diagnosis Date  . Medical history non-contributory     Past Surgical History:  Procedure Laterality Date  . NO PAST SURGERIES      Family History  Problem Relation Age of Onset  . ADD / ADHD Neg Hx   . Alcohol abuse Neg Hx   . Anxiety disorder Neg Hx   . Arthritis Neg Hx   . Asthma Neg Hx   . Birth defects Neg Hx   . Cancer Neg Hx   . COPD Neg Hx   . Depression Neg Hx   . Diabetes Neg Hx   . Drug abuse Neg Hx   . Early death Neg Hx   . Heart disease Neg Hx   . Hearing loss Neg Hx   . Hypertension Neg Hx   . Intellectual disability Neg Hx   . Kidney disease Neg Hx   . Learning disabilities Neg Hx   . Miscarriages / Stillbirths Neg Hx   . Obesity Neg Hx   . Stroke Neg Hx   . Vision loss Neg Hx   . Hyperlipidemia Neg Hx   . Varicose Veins Neg Hx     Social History   Tobacco Use  . Smoking status: Never Smoker  . Smokeless tobacco: Never Used  Substance Use Topics   . Alcohol use: Not Currently    Comment: occ  . Drug use: No    Allergies: No Known Allergies  Medications Prior to Admission  Medication Sig Dispense Refill Last Dose  . ferrous sulfate 325 (65 FE) MG EC tablet Take 1 tablet (325 mg total) by mouth 2 (two) times daily. (Patient not taking: Reported on 05/19/2018) 60 tablet 3 Not Taking at Unknown time  . ibuprofen (ADVIL,MOTRIN) 600 MG tablet Take 1 tablet (600 mg total) by mouth every 6 (six) hours. 30 tablet 0   . oxyCODONE-acetaminophen (PERCOCET/ROXICET) 5-325 MG tablet Take 1-2 tablets by mouth every 4 (four) hours as needed for severe pain. 10 tablet 0     Review of Systems  Constitutional: Negative.   Respiratory: Negative.   Cardiovascular: Negative.   Gastrointestinal: Negative for constipation, diarrhea, nausea and vomiting.  Genitourinary: Positive for  pelvic pain. Negative for difficulty urinating, dysuria, frequency, hematuria, vaginal bleeding and vaginal discharge.  Musculoskeletal: Positive for back pain.  Neurological: Negative.    Physical Exam   Blood pressure 119/70, pulse 93, temperature 98.4 F (36.9 C), temperature source Oral, resp. rate 18, height 5\' 3"  (1.6 m), weight 99.2 kg, last menstrual period 08/03/2017, SpO2 100 %, unknown if currently breastfeeding.  Physical Exam  Nursing note and vitals reviewed. Constitutional: She is oriented to person, place, and time. She appears well-developed and well-nourished. No distress.  Cardiovascular: Normal rate, regular rhythm and normal heart sounds.  Respiratory: Effort normal and breath sounds normal. No respiratory distress. She has no wheezes. She has no rales.  GI: She exhibits no distension. There is abdominal tenderness. There is CVA tenderness. There is no rebound and no guarding.  CVA tenderness on left   Genitourinary:    Genitourinary Comments: Bimanual exam: Cervix 0/long/high, firm, anterior, neg CMT, uterus nontender, nonenlarged, right adnexa  without tenderness, enlargement, or mass,  left adnexa with tenderness, no enlargement, or mass palpated.   Musculoskeletal: Normal range of motion.        General: No edema.  Neurological: She is alert and oriented to person, place, and time.  Psychiatric: She has a normal mood and affect. Her behavior is normal. Thought content normal.    MAU Course  Procedures  MDM Orders Placed This Encounter  Procedures  . Wet prep, genital  . US OB LESS THAN 14 WEEKS WITH OB TRANSVAGINAL  . CBC  . hCG, quantitative, pregnancy  . Urinalysis, Routine w reflex microscopic  . Pregnancy, urine POC   Labs and [place] results reviewed:  Results for orders placed or performed during the hospital encounter of 10/14/18 (from the past 24 hour(s))  Wet prep, genital     Status: Abnormal   Collection Time: 10/14/18  5:08 PM  Result Value Ref Range   Yeast Wet Prep HPF POC PRESENT (A) NONE SEEN   Trich, Wet Prep NONE SEEN NONE SEEN   Clue Cells Wet Prep HPF POC PRESENT (A) NONE SEEN   WBC, Wet Prep HPF POC MANY (A) NONE SEEN   Sperm NONE SEEN   Pregnancy, urine POC     Status: Abnormal   Collection Time: 10/14/18  5:15 PM  Result Value Ref Range   Preg Test, Ur POSITIVE (A) NEGATIVE  Urinalysis, Routine w reflex microscopic     Status: Abnormal   Collection Time: 10/14/18  5:17 PM  Result Value Ref Range   Color, Urine YELLOW YELLOW   APPearance HAZY (A) CLEAR   Specific Gravity, Urine 1.028 1.005 - 1.030   pH 5.0 5.0 - 8.0   Glucose, UA NEGATIVE NEGATIVE mg/dL   Hgb urine dipstick NEGATIVE NEGATIVE   Bilirubin Urine NEGATIVE NEGATIVE   Ketones, ur NEGATIVE NEGATIVE mg/dL   Protein, ur NEGATIVE NEGATIVE mg/dL   Nitrite NEGATIVE NEGATIVE   Leukocytes,Ua NEGATIVE NEGATIVE  CBC     Status: Abnormal   Collection Time: 10/14/18  5:32 PM  Result Value Ref Range   WBC 5.8 4.0 - 10.5 K/uL   RBC 4.26 3.87 - 5.11 MIL/uL   Hemoglobin 11.6 (L) 12.0 - 15.0 g/dL   HCT 35.9 (L) 36.0 - 46.0 %   MCV 84.3  80.0 - 100.0 fL   MCH 27.2 26.0 - 34.0 pg   MCHC 32.3 30.0 - 36.0 g/dL   RDW 13.5 11.5 - 15.5 %   Platelets 282 150 - 400 K/uL  nRBC 0.0 0.0 - 0.2 %  hCG, quantitative, pregnancy     Status: Abnormal   Collection Time: 10/14/18  5:32 PM  Result Value Ref Range   hCG, Beta Chain, Quant, S 25,103 (H) <5 mIU/mL   US Ob Less Than 14 Weeks With Ob Transvaginal  Result Date: 10/14/2018 CLINICAL DATA:  Lower abdominal and back pain since last night. EXAM: OBSTETRIC <14 WK [place] AND TRANSVAGINAL OB US TECHNIQUE: Both transabdominal and transvaginal ultrasound examinations were performed for complete evaluation of the gestation as well as the maternal uterus, adnexal regions, and pelvic cul-de-sac. Transvaginal technique was performed to assess early pregnancy. COMPARISON:  05/18/2018 FINDINGS: Intrauterine gestational sac: Single Yolk sac:  Visualized and mildly irregular Embryo:  Visualized Cardiac Activity: Visualized Heart Rate: 88 bpm CRL:  2.3 mm   5 w   5 d Subchorionic hemorrhage:  Possible small subchorionic hemorrhage. Maternal uterus/adnexae: Unremarkable right ovary. Left ovarian corpus luteum. IMPRESSION: Single living intrauterine gestation as above.  Fetal bradycardia. Electronically Signed   By: Logan Bores M.D.   On: 10/14/2018 18:43   Discussed results of [place] and lab work with patient. Educated on [facility] and what to expect with possible vaginal bleeding from small [facility], discussed pelvic rest with patient. Rx for PNV and yeast infection sent to pharmacy of choice. Encouraged to make appointment for initial prenatal with [facility]. Pt stable at time of discharge. Dating changed by early [place] today, [redacted]w[redacted]d by [place] today.   Assessment and Plan   1. Normal IUP (intrauterine pregnancy) on prenatal ultrasound, first trimester   2. Abdominal pain during pregnancy   3. [redacted] weeks gestation of pregnancy   4. Subchorionic hematoma in first trimester, single or unspecified fetus    Discharge home Make initial  prenatal appointment  Return to MAU as needed [facility] precautions and pelvic rest  Rx for terazol and PNV   Follow-up [facility] Follow up.   Why:  Make appointment to be seen to initiate prenatal care  Contact information: [address] [place] [id number] [place] 10/14/2018, 7:02 PM

## 2018-10-14 NOTE — MAU Note (Signed)
Sharp pains in lower abd and low back.  Started last night.  Last full blown period was in Feb, only had spotting in March.  +HPT last wk.  Hx of miscarriage and high risk pregnancies.

## 2018-10-15 LAB — GC/CHLAMYDIA PROBE AMP (~~LOC~~) NOT AT ARMC
Chlamydia: NEGATIVE
Neisseria Gonorrhea: NEGATIVE

## 2018-12-12 IMAGING — US US OB < 14 WEEKS - US OB TV
1 series · 14 of 28 positions shown · non-contrast
Comparison: None.

CLINICAL DATA: Vaginal bleeding

EXAM:
OBSTETRIC <14 WK US AND TRANSVAGINAL OB US
TECHNIQUE: Both transabdominal and transvaginal ultrasound examinations were
performed for complete evaluation of the gestation as well as the
maternal uterus, adnexal regions, and pelvic cul-de-sac.
Transvaginal technique was performed to assess early pregnancy.

[Series 1: us ob < 14 weeks - us ob tv · 0.26mm/px · 14 of 68 slices shown]
[im 3/68]
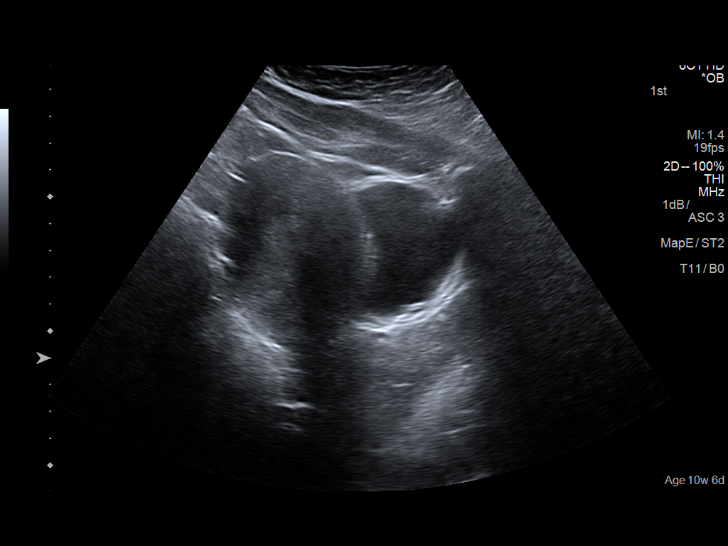
[im 8/68]
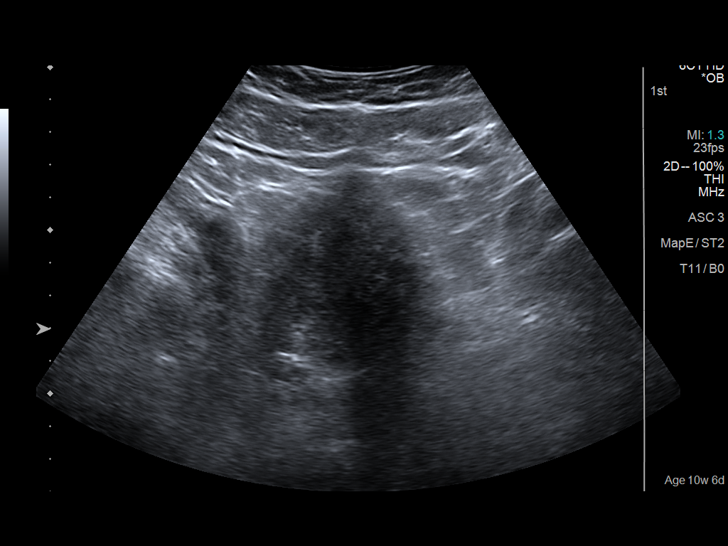
[im 13/68]
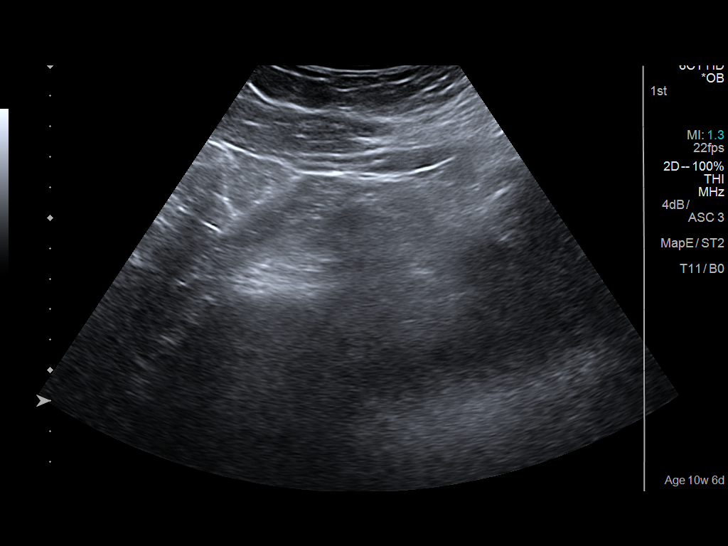
[im 18/68]
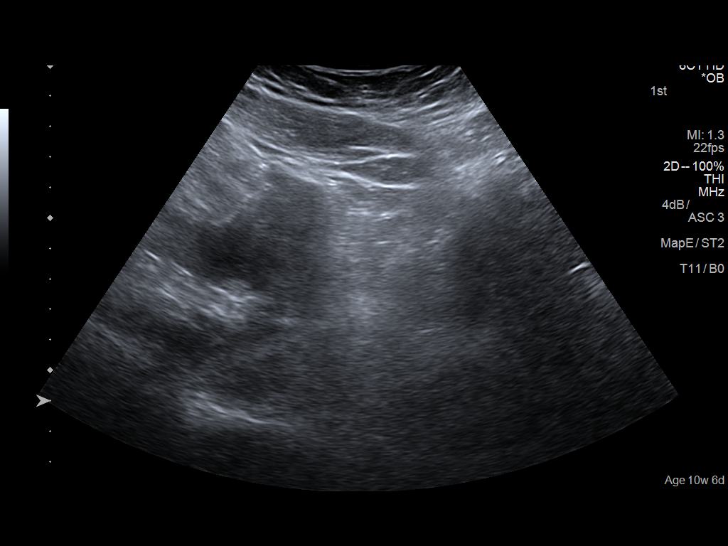
[im 23/68]
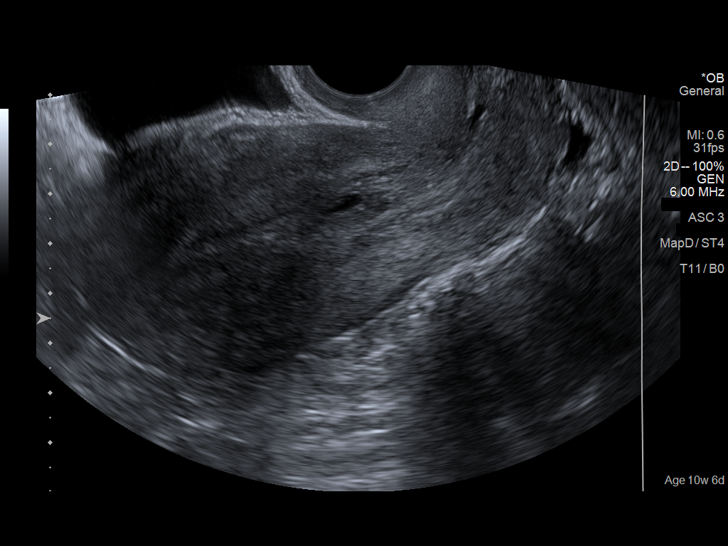
[im 28/68]
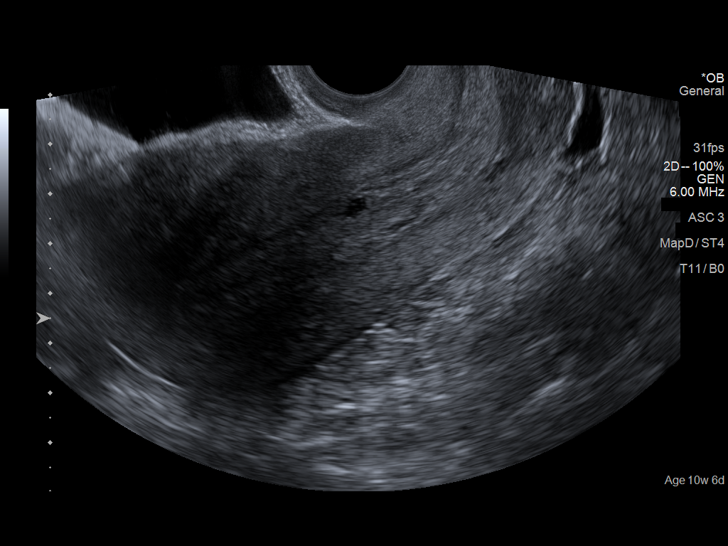
[im 33/68]
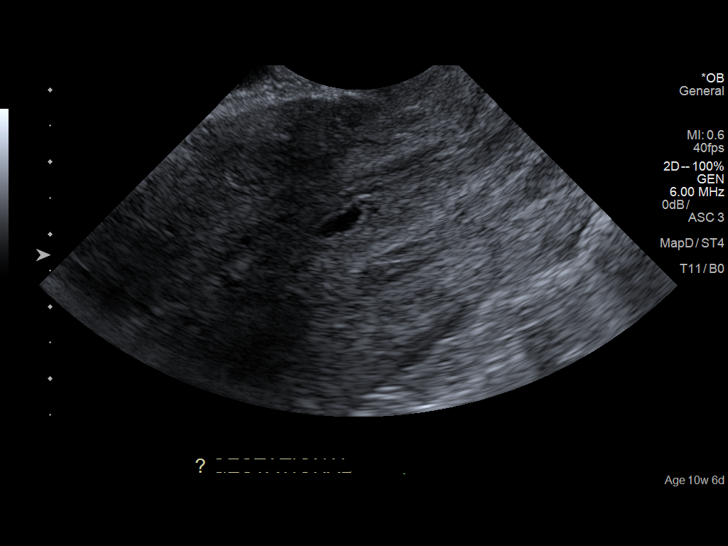
[im 38/68]
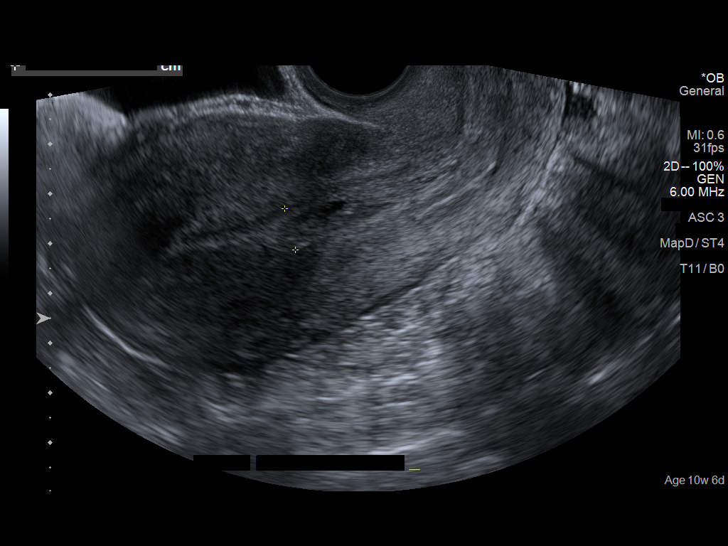
[im 43/68]
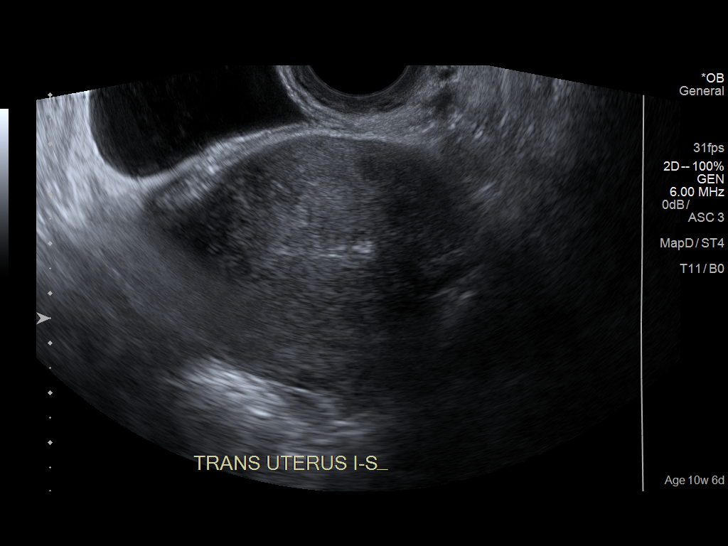
[im 48/68]
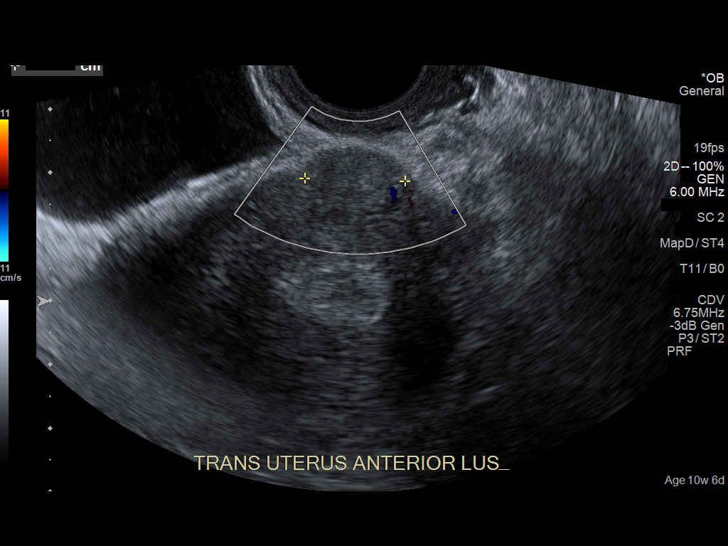
[im 53/68]
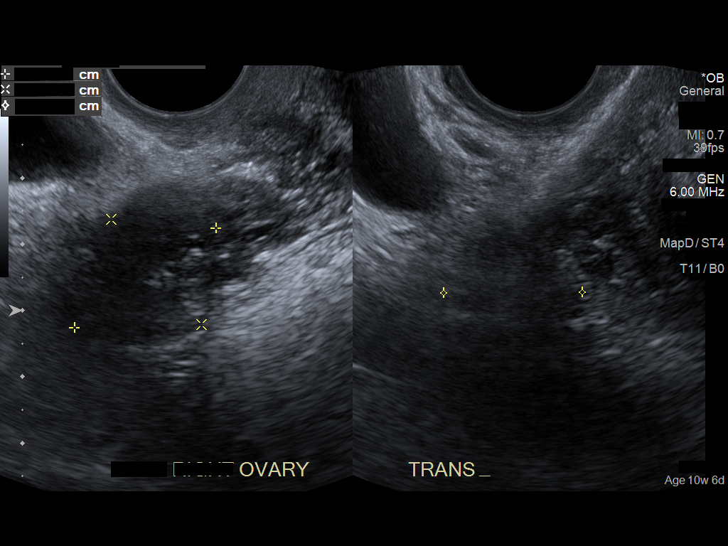
[im 58/68]
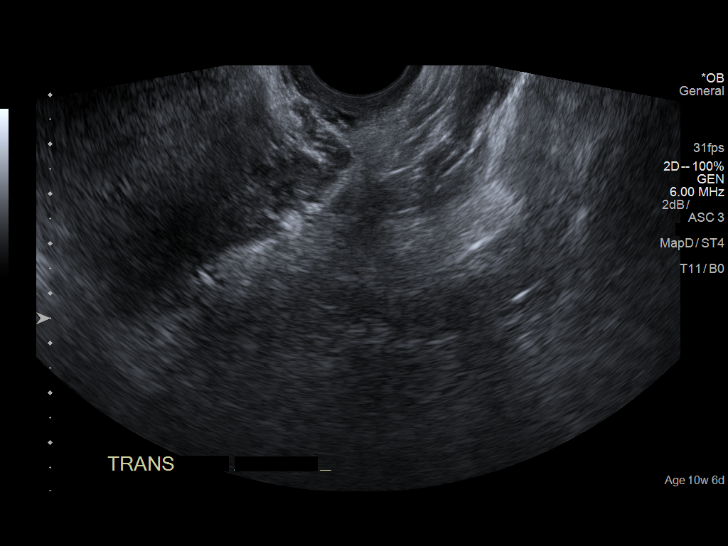
[im 63/68]
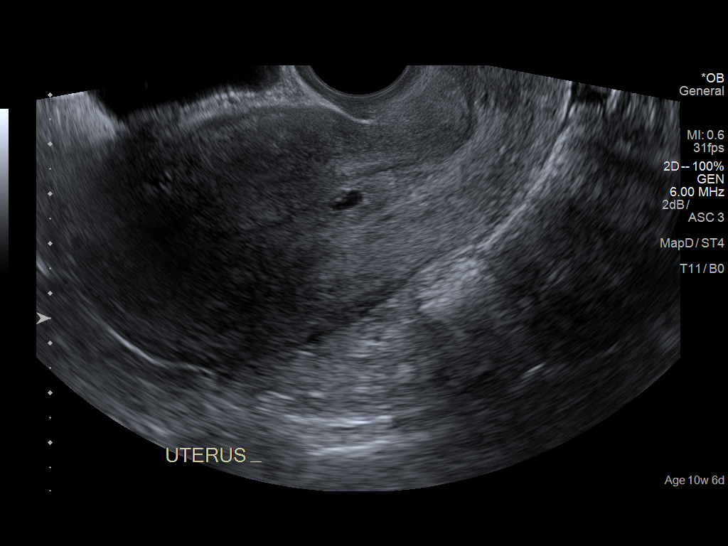
[im 68/68]
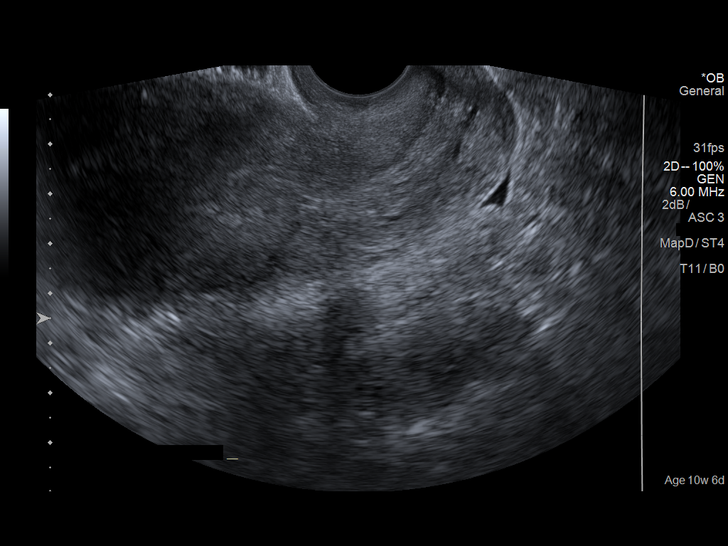

[14 of 28 positions shown; findings below may reference images not displayed]

FINDINGS: Intrauterine gestational sac: Question small irregular gestational
sac within the lower uterine segment

Yolk sac:  Not visualized

Embryo:  Not visualized

Cardiac Activity: Not visualized

Heart Rate:   bpm

MSD: 6.0 mm   5 w   to d

CRL:    mm    w    d                  US EDC:

Subchorionic hemorrhage:  None visualized.

Maternal uterus/adnexae: Small anterior fibroid measures 1.6 cm.
Endometrium 9 mm in thickness.
IMPRESSION: Possible irregular gestational sac in the lower uterine segment, 5
weeks 2 days by mean sac diameter. Appearance and location
concerning for possible spontaneous abortion in progress. This could
be followed with serial quantitative beta HCGs and ultrasounds.

## 2019-08-03 IMAGING — US OBSTETRIC <14 WK US AND TRANSVAGINAL OB US
1 series · 15 of 28 positions shown · non-contrast
Comparison: 05/18/2018

CLINICAL DATA: Lower abdominal and back pain since last night.

EXAM:
OBSTETRIC <14 WK US AND TRANSVAGINAL OB US
TECHNIQUE: Both transabdominal and transvaginal ultrasound examinations were
performed for complete evaluation of the gestation as well as the
maternal uterus, adnexal regions, and pelvic cul-de-sac.
Transvaginal technique was performed to assess early pregnancy.

[Series 1: obstetric <14 wk us and transvaginal ob us · 15 of 47 slices shown]
[im 1/47]
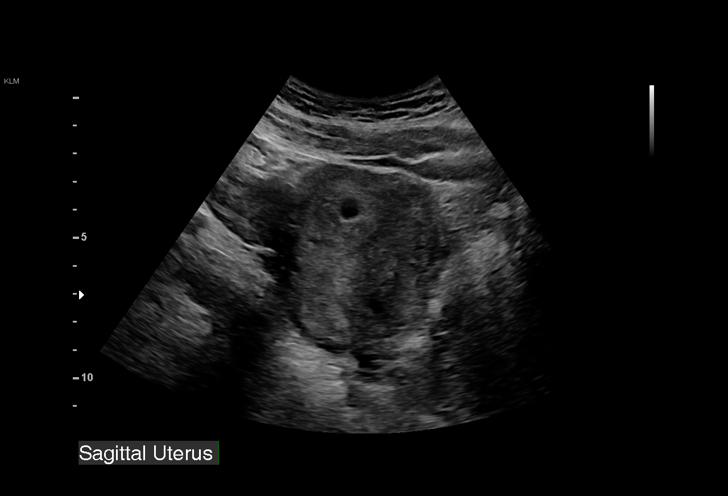
[im 4/47]
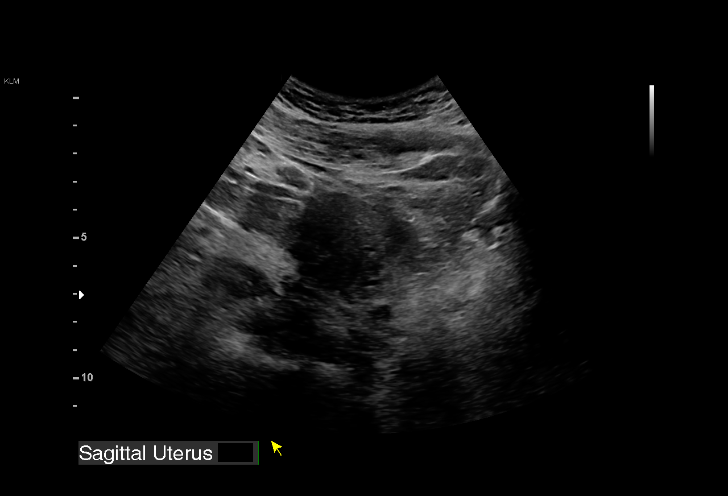
[im 7/47]
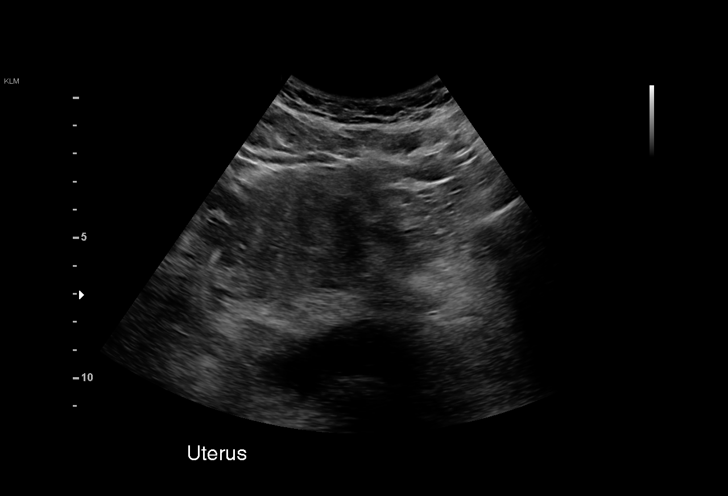
[im 11/47]
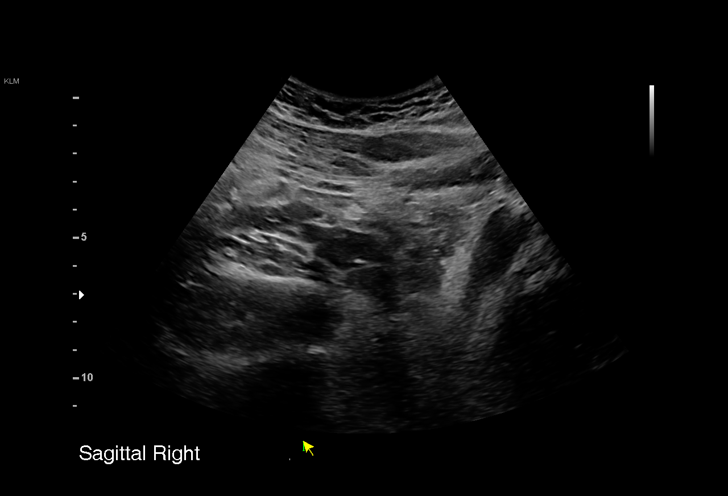
[im 14/47]
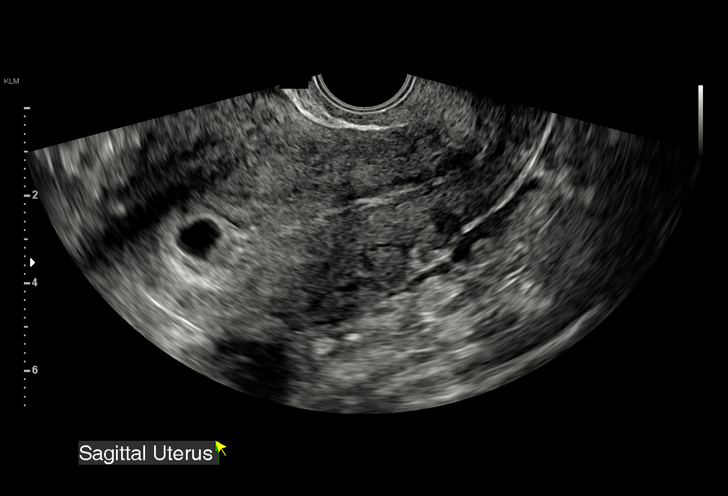
[im 18/47]
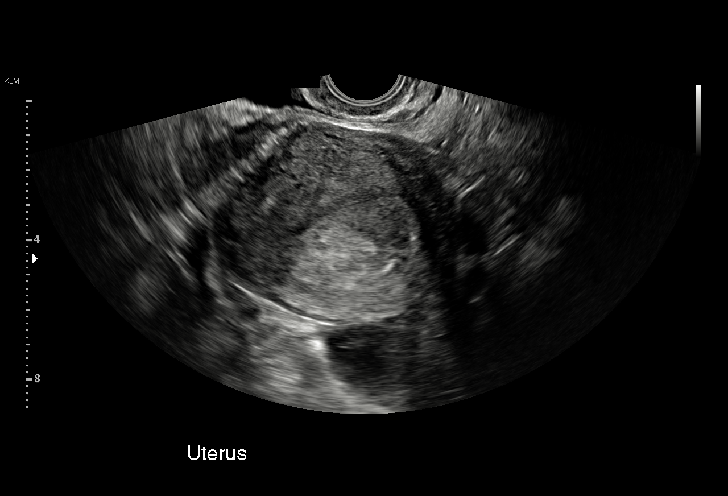
[im 21/47]
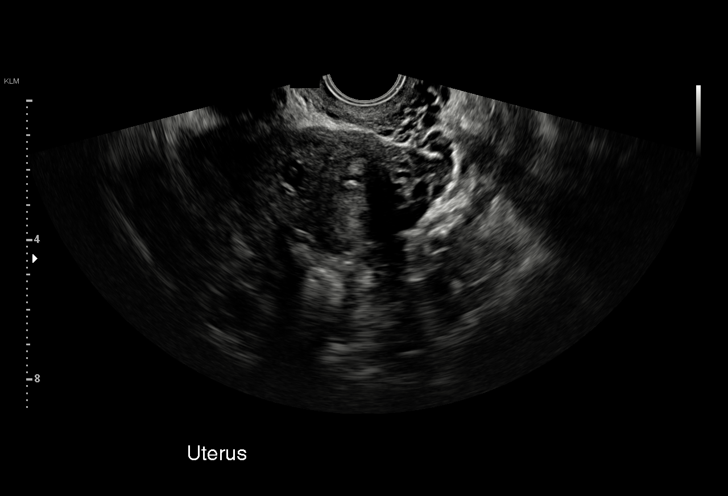
[im 24/47]
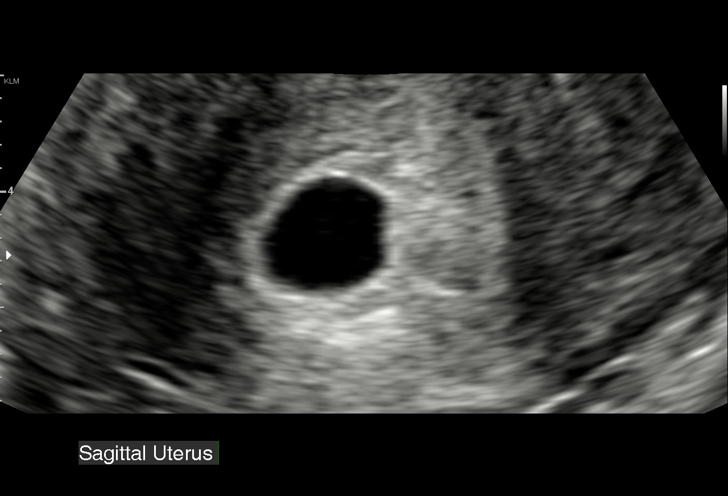
[im 26/47]
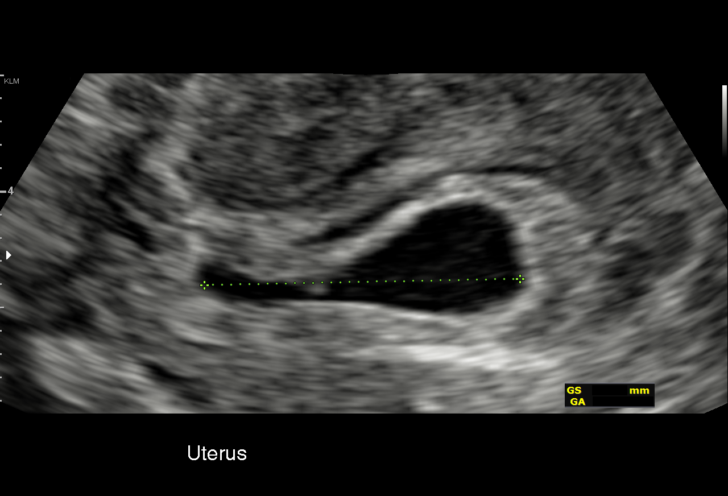
[im 29/47]
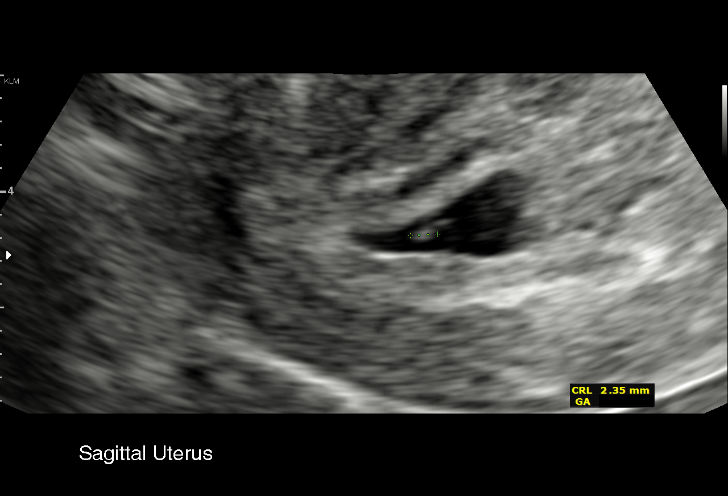
[im 33/47]
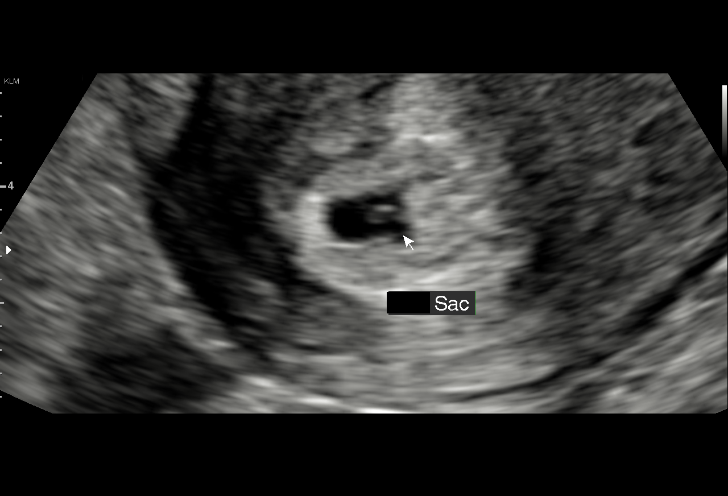
[im 36/47]
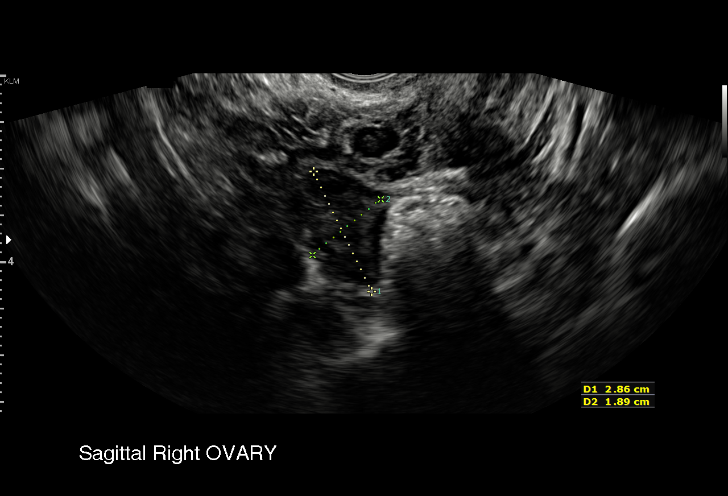
[im 40/47]
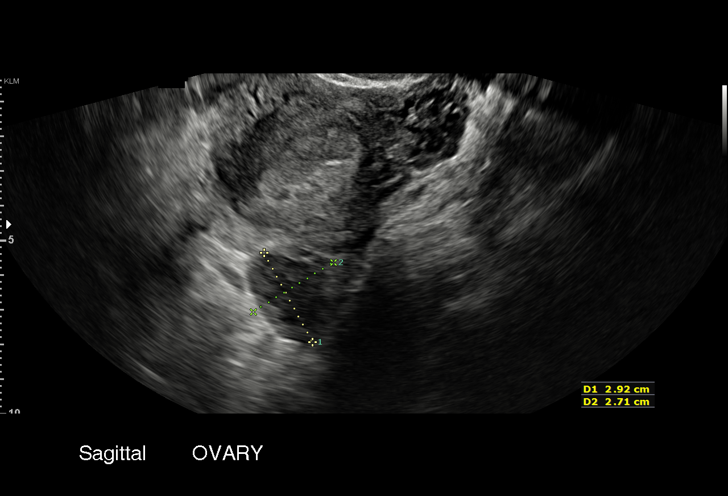
[im 43/47]
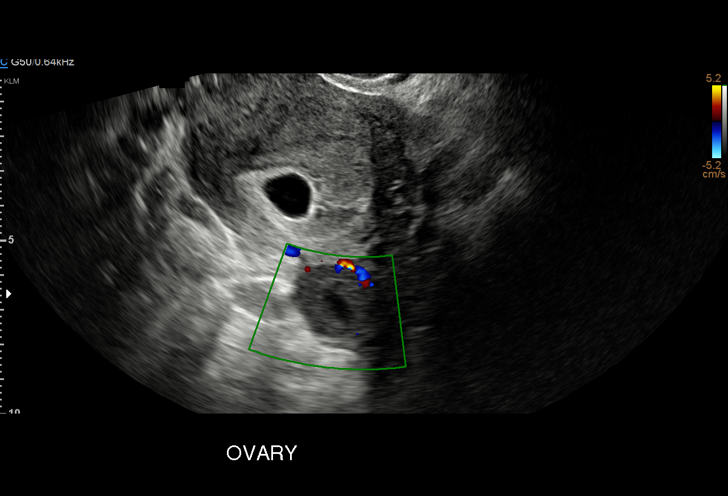
[im 47/47]
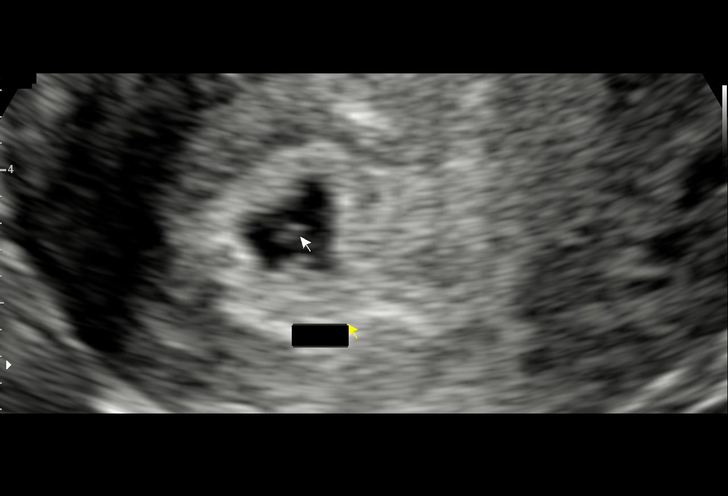

[15 of 28 positions shown; findings below may reference images not displayed]

FINDINGS: Intrauterine gestational sac: Single

Yolk sac:  Visualized and mildly irregular

Embryo:  Visualized

Cardiac Activity: Visualized

Heart Rate: 88 bpm

CRL:  2.3 mm   5 w   5 d

Subchorionic hemorrhage:  Possible small subchorionic hemorrhage.

Maternal uterus/adnexae: Unremarkable right ovary. Left ovarian
corpus luteum.
IMPRESSION: Single living intrauterine gestation as above.  Fetal bradycardia.

## 2020-01-28 ENCOUNTER — Other Ambulatory Visit: Payer: Self-pay

## 2020-01-28 ENCOUNTER — Inpatient Hospital Stay (HOSPITAL_COMMUNITY)
Admission: AD | Admit: 2020-01-28 | Discharge: 2020-01-28 | Disposition: A | Discharge status: Home / Self Care | Payer: Medicaid Other | Attending: Obstetrics and Gynecology | Admitting: Obstetrics and Gynecology

## 2020-01-28 ENCOUNTER — Inpatient Hospital Stay (HOSPITAL_COMMUNITY): Payer: Medicaid Other

## 2020-01-28 ENCOUNTER — Encounter (HOSPITAL_COMMUNITY): Payer: Self-pay | Admitting: Obstetrics and Gynecology

## 2020-01-28 DIAGNOSIS — O200 Threatened abortion: Secondary | ICD-10-CM | POA: Diagnosis not present

## 2020-01-28 DIAGNOSIS — O26891 Other specified pregnancy related conditions, first trimester: Secondary | ICD-10-CM | POA: Diagnosis not present

## 2020-01-28 DIAGNOSIS — B3731 Acute candidiasis of vulva and vagina: Secondary | ICD-10-CM | POA: Diagnosis present

## 2020-01-28 DIAGNOSIS — O210 Mild hyperemesis gravidarum: Secondary | ICD-10-CM | POA: Diagnosis not present

## 2020-01-28 DIAGNOSIS — R109 Unspecified abdominal pain: Secondary | ICD-10-CM | POA: Insufficient documentation

## 2020-01-28 DIAGNOSIS — D259 Leiomyoma of uterus, unspecified: Secondary | ICD-10-CM | POA: Insufficient documentation

## 2020-01-28 DIAGNOSIS — O3411 Maternal care for benign tumor of corpus uteri, first trimester: Secondary | ICD-10-CM | POA: Insufficient documentation

## 2020-01-28 DIAGNOSIS — O26851 Spotting complicating pregnancy, first trimester: Secondary | ICD-10-CM | POA: Diagnosis present

## 2020-01-28 DIAGNOSIS — O26859 Spotting complicating pregnancy, unspecified trimester: Secondary | ICD-10-CM

## 2020-01-28 DIAGNOSIS — O23591 Infection of other part of genital tract in pregnancy, first trimester: Secondary | ICD-10-CM | POA: Insufficient documentation

## 2020-01-28 DIAGNOSIS — B373 Candidiasis of vulva and vagina: Secondary | ICD-10-CM

## 2020-01-28 DIAGNOSIS — O26899 Other specified pregnancy related conditions, unspecified trimester: Secondary | ICD-10-CM | POA: Diagnosis present

## 2020-01-28 DIAGNOSIS — Z3A01 Less than 8 weeks gestation of pregnancy: Secondary | ICD-10-CM | POA: Diagnosis not present

## 2020-01-28 DIAGNOSIS — O208 Other hemorrhage in early pregnancy: Secondary | ICD-10-CM | POA: Insufficient documentation

## 2020-01-28 DIAGNOSIS — O209 Hemorrhage in early pregnancy, unspecified: Secondary | ICD-10-CM | POA: Diagnosis present

## 2020-01-28 LAB — URINALYSIS, ROUTINE W REFLEX MICROSCOPIC
Bilirubin Urine: NEGATIVE
Glucose, UA: NEGATIVE mg/dL
Hgb urine dipstick: NEGATIVE
Ketones, ur: NEGATIVE mg/dL
Nitrite: NEGATIVE
Protein, ur: NEGATIVE mg/dL
Specific Gravity, Urine: 1.024 (ref 1.005–1.030)
WBC, UA: >50 WBC/hpf — ABNORMAL HIGH (ref 0–5)
pH: 6 (ref 5.0–8.0)

## 2020-01-28 LAB — CBC
HCT: 33.7 % — ABNORMAL LOW (ref 36.0–46.0)
Hemoglobin: 11 g/dL — ABNORMAL LOW (ref 12.0–15.0)
MCH: 26.9 pg (ref 26.0–34.0)
MCHC: 32.6 g/dL (ref 30.0–36.0)
MCV: 82.4 fL (ref 80.0–100.0)
Platelets: 315 10*3/uL (ref 150–400)
RBC: 4.09 MIL/uL (ref 3.87–5.11)
RDW: 15.6 % — ABNORMAL HIGH (ref 11.5–15.5)
WBC: 4.4 10*3/uL (ref 4.0–10.5)
nRBC: 0 % (ref 0.0–0.2)

## 2020-01-28 LAB — HIV ANTIBODY (ROUTINE TESTING W REFLEX): HIV Screen 4th Generation wRfx: NONREACTIVE

## 2020-01-28 LAB — WET PREP, GENITAL
Clue Cells Wet Prep HPF POC: NONE SEEN
Sperm: NONE SEEN
Trich, Wet Prep: NONE SEEN

## 2020-01-28 LAB — HCG, QUANTITATIVE, PREGNANCY: hCG, Beta Chain, Quant, S: 57523 m[IU]/mL — ABNORMAL HIGH (ref <5)

## 2020-01-28 LAB — POCT PREGNANCY, URINE: Preg Test, Ur: POSITIVE — AB

## 2020-01-28 MED ORDER — TERCONAZOLE 0.4 % VA CREA: 1.0000 | TOPICAL_CREAM | Freq: Every day | VAGINAL | 0 refills | Status: AC

## 2020-01-28 MED ORDER — METOCLOPRAMIDE HCL 10 MG PO TABS: 10.0000 mg | ORAL_TABLET | Freq: Four times a day (QID) | ORAL | 0 refills | Status: DC

## 2020-01-28 NOTE — MAU Provider Note (Signed)
History     CSN: [id number]  Arrival date and time: 01/28/20 3903   First Provider Initiated Contact with Patient 01/28/20 0815      Chief Complaint  Patient presents with  . Abdominal Pain  . Possible Pregnancy   [name] is a 34 y.o. year old [id number] female at [redacted]w[redacted]d weeks gestation by unsure LMP who presents to MAU reporting lower abdominal cramping/pain and vaginal spotting for 2 days.  She rates the pain 8 out of 10.  She has not tried any medication for the pain.  She reports that her menstrual period has been kind of "wacky" since May.  She reports that in May her menstrual cycle was 1 day.  She reports going to the [facility] yesterday but left after being triaged because she was told by the PA that the wait would be 5 hours before she could get to a room (childcare issues).  She denies any recent sexual intercourse; last being 2 weeks ago. She denies vomiting, but does have nausea x 2 weeks. OB history explained: SAB x 4 (2005, 2008, 2010, and 2020)  Full-Term SVD x 2 07/05/2004 (in [place], [place]) and 0/03/2329 -- complication of PPH after delivery (with [facility])   OB History    Gravida  7   Para  2   Term  2   Preterm      AB  4   Living  2     SAB  4   TAB      Ectopic      Multiple  0   Live Births  2           Past Medical History:  Diagnosis Date  . Anxiety   . Depression     Past Surgical History:  Procedure Laterality Date  . NO PAST SURGERIES      Family History  Problem Relation Age of Onset  . ADD / ADHD Neg Hx   . Alcohol abuse Neg Hx   . Anxiety disorder Neg Hx   . Arthritis Neg Hx   . Asthma Neg Hx   . Birth defects Neg Hx   . Cancer Neg Hx   . COPD Neg Hx   . Depression Neg Hx   . Diabetes Neg Hx   . Drug abuse Neg Hx   . Early death Neg Hx   . Heart disease Neg Hx   . Hearing loss Neg Hx   . Hypertension Neg Hx   . Intellectual disability Neg Hx   . Kidney disease Neg Hx   .  Learning disabilities Neg Hx   . Miscarriages / Stillbirths Neg Hx   . Obesity Neg Hx   . Stroke Neg Hx   . Vision loss Neg Hx   . Hyperlipidemia Neg Hx   . Varicose Veins Neg Hx     Social History   Tobacco Use  . Smoking status: Never Smoker  . Smokeless tobacco: Never Used  Vaping Use  . Vaping Use: Never used  Substance Use Topics  . Alcohol use: Not Currently    Comment: occ  . Drug use: No    Allergies: No Known Allergies  Medications Prior to Admission  Medication Sig Dispense Refill Last Dose  . Prenatal Vit-Fe Fumarate-FA (PREPLUS) 27-1 MG TABS Take 1 tablet by mouth daily. 30 tablet 13 01/28/2020 at Unknown time  . valACYclovir (VALTREX) 500 MG tablet Take 500 mg by mouth daily.  01/28/2020  . terconazole (TERAZOL 3) 0.8 % vaginal cream Place 1 applicator vaginally at bedtime. 20 g 0     Review of Systems  Constitutional: Negative.   HENT: Negative.   Eyes: Negative.   Respiratory: Negative.   Cardiovascular: Negative.   Gastrointestinal: Negative.   Endocrine: Negative.   Genitourinary: Positive for pelvic pain (cramping/pain x 2 days; 8/10) and vaginal bleeding (spotting x 2 days).  Musculoskeletal: Negative.   Allergic/Immunologic: Negative.   Neurological: Negative.   Hematological: Negative.   Psychiatric/Behavioral: Negative.    Physical Exam   Blood pressure (!) 107/64, pulse 93, temperature 98.6 F (37 C), resp. rate 16, height 5\' 3"  (1.6 m), weight 76.7 kg, last menstrual period 12/11/2019, SpO2 100 %, unknown if currently breastfeeding.  Physical Exam Vitals and nursing note reviewed. Exam conducted with a chaperone present.  Constitutional:      Appearance: She is well-developed and normal weight.  HENT:     Head: Normocephalic and atraumatic.  Cardiovascular:     Rate and Rhythm: Normal rate.  Pulmonary:     Effort: Pulmonary effort is normal.  Abdominal:     Palpations: Abdomen is soft.  Genitourinary:    Vagina: Normal.      Cervix: Normal.     Uterus: Normal. Enlarged.      Adnexa: Right adnexa normal.       Left: Tenderness present.      Comments: SE: cervix is smooth, pink, no lesions, scant amt of dark, red, mucoid blood in vaginal vault -- WP, GC/CT done, closed/long/firm, no CMT or friability, moderate LT adnexal tenderness  Skin:    General: Skin is warm and dry.  Neurological:     Mental Status: She is alert and oriented to person, place, and time.  Psychiatric:        Mood and Affect: Mood normal.        Behavior: Behavior normal.     MAU Course  Procedures  MDM CCUA UPT CBC ABO/Rh HCG Wet Prep GC/CT -- pending HIV -- pending OB < 14 wks [place] with TV  Results for orders placed or performed during the hospital encounter of 01/28/20 (from the past 24 hour(s))  Urinalysis, Routine w reflex microscopic     Status: Abnormal   Collection Time: 01/28/20  7:54 AM  Result Value Ref Range   Color, Urine YELLOW YELLOW   APPearance HAZY (A) CLEAR   Specific Gravity, Urine 1.024 1.005 - 1.030   pH 6.0 5.0 - 8.0   Glucose, UA NEGATIVE NEGATIVE mg/dL   Hgb urine dipstick NEGATIVE NEGATIVE   Bilirubin Urine NEGATIVE NEGATIVE   Ketones, ur NEGATIVE NEGATIVE mg/dL   Protein, ur NEGATIVE NEGATIVE mg/dL   Nitrite NEGATIVE NEGATIVE   Leukocytes,Ua MODERATE (A) NEGATIVE   RBC / HPF 6-10 0 - 5 RBC/hpf   WBC, UA >50 (H) 0 - 5 WBC/hpf   Bacteria, UA FEW (A) NONE SEEN   Squamous Epithelial / LPF 0-5 0 - 5   Mucus PRESENT   Pregnancy, urine POC     Status: Abnormal   Collection Time: 01/28/20  7:55 AM  Result Value Ref Range   Preg Test, Ur POSITIVE (A) NEGATIVE  Wet prep, genital     Status: Abnormal   Collection Time: 01/28/20  8:15 AM   Specimen: Vaginal  Result Value Ref Range   Yeast Wet Prep HPF POC PRESENT (A) NONE SEEN   Trich, Wet Prep NONE SEEN NONE SEEN   Clue Cells  Wet Prep HPF POC NONE SEEN NONE SEEN   WBC, Wet Prep HPF POC MANY (A) NONE SEEN   Sperm NONE SEEN   CBC     Status:  Abnormal   Collection Time: 01/28/20  8:27 AM  Result Value Ref Range   WBC 4.4 4.0 - 10.5 K/uL   RBC 4.09 3.87 - 5.11 MIL/uL   Hemoglobin 11.0 (L) 12.0 - 15.0 g/dL   HCT 33.7 (L) 36 - 46 %   MCV 82.4 80.0 - 100.0 fL   MCH 26.9 26.0 - 34.0 pg   MCHC 32.6 30.0 - 36.0 g/dL   RDW 15.6 (H) 11.5 - 15.5 %   Platelets 315 150 - 400 K/uL   nRBC 0.0 0.0 - 0.2 %  hCG, quantitative, pregnancy     Status: Abnormal   Collection Time: 01/28/20  8:27 AM  Result Value Ref Range   hCG, Beta Chain, Quant, S 57,523 (H) <5 mIU/mL  HIV Antibody (routine testing w rflx)     Status: None   Collection Time: 01/28/20  8:27 AM  Result Value Ref Range   HIV Screen 4th Generation wRfx Non Reactive Non Reactive    US OB LESS THAN 14 WEEKS WITH OB TRANSVAGINAL  Result Date: 01/28/2020 CLINICAL DATA:  Pelvic pain with vaginal spotting EXAM: OBSTETRIC <14 WK [place] AND TRANSVAGINAL OB US TECHNIQUE: Both transabdominal and transvaginal ultrasound examinations were performed for complete evaluation of the gestation as well as the maternal uterus, adnexal regions, and pelvic cul-de-sac. Transvaginal technique was performed to assess early pregnancy. COMPARISON:  None. FINDINGS: Intrauterine gestational sac: Visualized Yolk sac:  Visualized Embryo:  Visualized Cardiac Activity: Visualized Heart Rate: 122 bpm CRL:  8 mm   6 w   4 d                  [place] [facility]: September 18, 2020 Subchorionic hemorrhage: There is a subchorionic hemorrhage measuring 2.2 x 0.7 cm. Maternal uterus/adnexae: Cervical os closed. There is a leiomyoma in the lower uterine segment measuring 0.7 x 0.7 x 0.6 cm. Toward the right more superiorly in the uterus, there is a 1.2 x 1.1 x 0.9 cm leiomyoma. Right ovary measures 3.9 x 2.5 x 3.2 cm. Left ovary measures 4.8 x 1.8 x 1.8 cm. No extrauterine pelvic mass. No appreciable free pelvic fluid. IMPRESSION: 1. Single live intrauterine gestation with estimated gestational age of 6+ weeks. 2.  Subchorionic hemorrhage  measuring 2.2 x 0.7 cm. 3.  Small intrauterine leiomyomas as noted. 4.  No extrauterine pelvic mass or appreciable free pelvic fluid. Electronically Signed   By: Lowella Grip III M.D.   On: 01/28/2020 09:59     Assessment and Plan  1. Spotting in early pregnancy - Information provided on vaginal bleeding in pregnancy  - Return to MAU:  If you have heavier bleeding that soaks through more that 2 pads per hour for an hour or more  If you bleed so much that you feel like you might pass out or you do pass out  If you have significant abdominal pain that is not improved with Tylenol 1000 mg every 6 hours as needed for pain  If you develop a fever > 100.5  2. Abdominal pain during pregnancy in first trimester - Information provided on abdominal pain in pregnancy  - Advised to take Tylenol 1000 mg every 6 hours prn pain   3. Candida vaginitis - Rx for Terazol 7 cream to be used once VB decreases  4. Subchorionic Hemorrhage - Information provided on [facility] - Explained the increased risk for miscarriage - Reviewed s/sx's to return for as stated above   5. Threatened Miscarriage - Information provided on threatened miscarriage   6. Morning sickness - Rx for Reglan 10 mg every 6 hrs for nausea  - Discharge patient - Patient states she wants to pursue [facility] with [facility]; advised to call their office to get scheduled - Patient verbalized an understanding of the plan of care and agrees.     Laury Deep, MSN, CNM 01/28/2020, 8:15 AM

## 2020-01-28 NOTE — MAU Note (Signed)
.   Jocelyn Lucas is a 34 y.o. at [redacted]w[redacted]d here in MAU reporting: positive home pregnancy test. Lower abdominal pain and vaginal spotting for 2 days.  LMP: 12/11/19 Onset of complaint:  2 days Pain score: 8 Vitals:   01/28/20 0755  BP: (!) 107/64  Pulse: 93  Resp: 16  Temp: 98.6 F (37 C)  SpO2: 100%     FHT: Lab orders placed from triage: UA/UPT

## 2020-01-28 NOTE — Discharge Instructions (Signed)
Return to MAU:  If you have heavier bleeding that soaks through more that 2 pads per hour for an hour or more  If you bleed so much that you feel like you might pass out or you do pass out  If you have significant abdominal pain that is not improved with Tylenol 1000 mg every 6 hours as needed for pain  If you develop a fever > 100.5

## 2020-01-29 LAB — GC/CHLAMYDIA PROBE AMP (~~LOC~~) NOT AT ARMC
Chlamydia: NEGATIVE
Comment: NEGATIVE
Comment: NORMAL
Neisseria Gonorrhea: NEGATIVE

## 2020-04-02 ENCOUNTER — Inpatient Hospital Stay (HOSPITAL_COMMUNITY)
Admission: AD | Admit: 2020-04-02 | Discharge: 2020-04-02 | Disposition: A | Discharge status: Home / Self Care | Payer: Medicaid Other | Attending: Obstetrics & Gynecology | Admitting: Obstetrics & Gynecology

## 2020-04-02 ENCOUNTER — Encounter (HOSPITAL_COMMUNITY): Payer: Self-pay | Admitting: Obstetrics & Gynecology

## 2020-04-02 ENCOUNTER — Other Ambulatory Visit: Payer: Self-pay

## 2020-04-02 DIAGNOSIS — R102 Pelvic and perineal pain: Secondary | ICD-10-CM | POA: Diagnosis not present

## 2020-04-02 DIAGNOSIS — K5901 Slow transit constipation: Secondary | ICD-10-CM | POA: Insufficient documentation

## 2020-04-02 DIAGNOSIS — O09522 Supervision of elderly multigravida, second trimester: Secondary | ICD-10-CM | POA: Insufficient documentation

## 2020-04-02 DIAGNOSIS — Z3A16 16 weeks gestation of pregnancy: Secondary | ICD-10-CM | POA: Diagnosis not present

## 2020-04-02 DIAGNOSIS — Z79899 Other long term (current) drug therapy: Secondary | ICD-10-CM | POA: Diagnosis not present

## 2020-04-02 DIAGNOSIS — O99612 Diseases of the digestive system complicating pregnancy, second trimester: Secondary | ICD-10-CM

## 2020-04-02 DIAGNOSIS — O26892 Other specified pregnancy related conditions, second trimester: Secondary | ICD-10-CM | POA: Diagnosis not present

## 2020-04-02 DIAGNOSIS — R109 Unspecified abdominal pain: Secondary | ICD-10-CM | POA: Diagnosis not present

## 2020-04-02 DIAGNOSIS — O26899 Other specified pregnancy related conditions, unspecified trimester: Secondary | ICD-10-CM

## 2020-04-02 HISTORY — DX: Benign neoplasm of connective and other soft tissue, unspecified: D21.9

## 2020-04-02 LAB — URINALYSIS, ROUTINE W REFLEX MICROSCOPIC
Bacteria, UA: NONE SEEN
Bilirubin Urine: NEGATIVE
Glucose, UA: NEGATIVE mg/dL
Hgb urine dipstick: NEGATIVE
Ketones, ur: 20 mg/dL — AB
Nitrite: NEGATIVE
Protein, ur: NEGATIVE mg/dL
Specific Gravity, Urine: 1.024 (ref 1.005–1.030)
pH: 6 (ref 5.0–8.0)

## 2020-04-02 LAB — WET PREP, GENITAL
Sperm: NONE SEEN
Trich, Wet Prep: NONE SEEN
Yeast Wet Prep HPF POC: NONE SEEN

## 2020-04-02 NOTE — Discharge Instructions (Signed)
Round Ligament Pain  The round ligament is a cord of muscle and tissue that helps support the uterus. It can become a source of pain during pregnancy if it becomes stretched or twisted as the baby grows. The pain usually begins in the second trimester (13-28 weeks) of pregnancy, and it can come and go until the baby is delivered. It is not a serious problem, and it does not cause harm to the baby. Round ligament pain is usually a short, sharp, and pinching pain, but it can also be a dull, lingering, and aching pain. The pain is felt in the lower side of the abdomen or in the groin. It usually starts deep in the groin and moves up to the outside of the hip area. The pain may occur when you:  Suddenly change position, such as quickly going from a sitting to standing position.  Roll over in bed.  Cough or sneeze.  Do physical activity. Follow these instructions at home:   Watch your condition for any changes.  When the pain starts, relax. Then try any of these methods to help with the pain: ? Sitting down. ? Flexing your knees up to your abdomen. ? Lying on your side with one pillow under your abdomen and another pillow between your legs. ? Sitting in a warm bath for 15-20 minutes or until the pain goes away.  Take over-the-counter and prescription medicines only as told by your health care provider.  Move slowly when you sit down or stand up.  Avoid long walks if they cause pain.  Stop or reduce your physical activities if they cause pain.  Keep all follow-up visits as told by your health care provider. This is important. Contact a health care provider if:  Your pain does not go away with treatment.  You feel pain in your back that you did not have before.  Your medicine is not helping. Get help right away if:  You have a fever or chills.  You develop uterine contractions.  You have vaginal bleeding.  You have nausea or vomiting.  You have diarrhea.  You have pain  when you urinate. Summary  Round ligament pain is felt in the lower abdomen or groin. It is usually a short, sharp, and pinching pain. It can also be a dull, lingering, and aching pain.  This pain usually begins in the second trimester (13-28 weeks). It occurs because the uterus is stretching with the growing baby, and it is not harmful to the baby.  You may notice the pain when you suddenly change position, when you cough or sneeze, or during physical activity.  Relaxing, flexing your knees to your abdomen, lying on one side, or taking a warm bath may help to get rid of the pain.  Get help from your health care provider if the pain does not go away or if you have vaginal bleeding, nausea, vomiting, diarrhea, or painful urination. This information is not intended to replace advice given to you by your health care provider. Make sure you discuss any questions you have with your health care provider. Document Revised: 12/05/2017 Document Reviewed: 12/05/2017 Elsevier Patient Education  [employer].   Constipation, Adult Constipation is when a person:  Poops (has a bowel movement) fewer times in a week than normal.  Has a hard time pooping.  Has poop that is dry, hard, or bigger than normal. Follow these instructions at home: Eating and drinking   Eat foods that have a lot of  fiber, such as: ? Fresh fruits and vegetables. ? Whole grains. ? Beans.  Eat less of foods that are high in fat, low in fiber, or overly processed, such as: ? [place] fries. ? Hamburgers. ? Cookies. ? Candy. ? Soda.  Drink enough fluid to keep your pee (urine) clear or pale yellow. General instructions  Exercise regularly or as told by your doctor.  Go to the restroom when you feel like you need to poop. Do not hold it in.  Take over-the-counter and prescription medicines only as told by your doctor. These include any fiber supplements.  Do pelvic floor retraining exercises, such as: ? Doing  deep breathing while relaxing your lower belly (abdomen). ? Relaxing your pelvic floor while pooping.  Watch your condition for any changes.  Keep all follow-up visits as told by your doctor. This is important. Contact a doctor if:  You have pain that gets worse.  You have a fever.  You have not pooped for 4 days.  You throw up (vomit).  You are not hungry.  You lose weight.  You are bleeding from the anus.  You have thin, pencil-like poop (stool). Get help right away if:  You have a fever, and your symptoms suddenly get worse.  You leak poop or have blood in your poop.  Your belly feels hard or bigger than normal (is bloated).  You have very bad belly pain.  You feel dizzy or you faint. This information is not intended to replace advice given to you by your health care provider. Make sure you discuss any questions you have with your health care provider. Document Revised: 06/01/2017 Document Reviewed: 12/08/2015 Elsevier Patient Education  2020 [employer].

## 2020-04-02 NOTE — MAU Note (Signed)
Pain is just as bad when sitting as standing.

## 2020-04-02 NOTE — MAU Note (Signed)
Keep getting sharp pains in her pelvic area.  Started feeling it last night. Sharp, stabbing pain.  Took tylenol to see if it would ease off, but they are still happening.  Has 2 cysts. Doesn't know if pain is related to the cysts.  Happens "randomly".

## 2020-04-02 NOTE — MAU Provider Note (Signed)
History     CSN: [id number]  Arrival date and time: 04/02/20 1053   First Provider Initiated Contact with Patient 04/02/20 1325      Chief Complaint  Patient presents with  . Abdominal Pain   34 y.o. [id number] @16 .1 wks presenting with LAP/pelvic pain. Pain started last night. Pain is bilateral, sharp, and intermittent. Rates 7/10. She took Tylenol but it didn't help. Denies VB or discharge. No fevers. No N/V. Reports constipation with last BM 2 days ago. Denies urinary sx.    OB History    Gravida  7   Para  2   Term  2   Preterm      AB  4   Living  2     SAB  4   TAB      Ectopic      Multiple  0   Live Births  2           Past Medical History:  Diagnosis Date  . Anxiety   . Fibroid     Past Surgical History:  Procedure Laterality Date  . NO PAST SURGERIES      Family History  Problem Relation Age of Onset  . Hypertension Mother   . Hypertension Father   . ADD / ADHD Neg Hx   . Alcohol abuse Neg Hx   . Anxiety disorder Neg Hx   . Arthritis Neg Hx   . Asthma Neg Hx   . Birth defects Neg Hx   . Cancer Neg Hx   . COPD Neg Hx   . Depression Neg Hx   . Diabetes Neg Hx   . Drug abuse Neg Hx   . Early death Neg Hx   . Heart disease Neg Hx   . Hearing loss Neg Hx   . Intellectual disability Neg Hx   . Kidney disease Neg Hx   . Learning disabilities Neg Hx   . Miscarriages / Stillbirths Neg Hx   . Obesity Neg Hx   . Stroke Neg Hx   . Vision loss Neg Hx   . Hyperlipidemia Neg Hx   . Varicose Veins Neg Hx     Social History   Tobacco Use  . Smoking status: Never Smoker  . Smokeless tobacco: Never Used  Vaping Use  . Vaping Use: Never used  Substance Use Topics  . Alcohol use: Not Currently    Comment: occ  . Drug use: No    Allergies: No Known Allergies  Medications Prior to Admission  Medication Sig Dispense Refill Last Dose  . metoCLOPramide (REGLAN) 10 MG tablet Take 1 tablet (10 mg total) by mouth every 6 (six) hours. 30  tablet 0 Past Week at Unknown time  . Prenatal Vit-Fe Fumarate-FA (PREPLUS) 27-1 MG TABS Take 1 tablet by mouth daily. 30 tablet 13 04/02/2020 at Unknown time  . valACYclovir (VALTREX) 500 MG tablet Take 500 mg by mouth daily.   04/01/2020 at Unknown time    Review of Systems  Constitutional: Negative for chills and fever.  Gastrointestinal: Positive for abdominal pain and constipation. Negative for nausea and vomiting.  Genitourinary: Positive for pelvic pain. Negative for dysuria, frequency, hematuria, vaginal bleeding and vaginal discharge.   Physical Exam   Blood pressure 119/68, pulse 98, temperature 98.5 F (36.9 C), temperature source Oral, resp. rate 17, height 5\' 3"  (1.6 m), weight 81.7 kg, last menstrual period 12/11/2019, unknown if currently breastfeeding.  Physical Exam Vitals and nursing note reviewed. Exam conducted with  a chaperone present.  Constitutional:      General: She is not in acute distress.    Appearance: Normal appearance.  HENT:     Head: Normocephalic and atraumatic.  Cardiovascular:     Rate and Rhythm: Normal rate.  Pulmonary:     Effort: Pulmonary effort is normal. No respiratory distress.  Abdominal:     General: There is no distension.     Palpations: Abdomen is soft.     Tenderness: There is no abdominal tenderness. There is no guarding or rebound.  Genitourinary:    Comments: VE: closed/thick Musculoskeletal:        General: Normal range of motion.     Cervical back: Normal range of motion.  Skin:    General: Skin is warm and dry.  Neurological:     General: No focal deficit present.     Mental Status: She is alert.  Psychiatric:        Mood and Affect: Mood normal.   FHT 154  Results for orders placed or performed during the hospital encounter of 04/02/20 (from the past 24 hour(s))  Urinalysis, Routine w reflex microscopic Urine, Clean Catch     Status: Abnormal   Collection Time: 04/02/20 12:12 PM  Result Value Ref Range   Color,  Urine YELLOW YELLOW   APPearance CLEAR CLEAR   Specific Gravity, Urine 1.024 1.005 - 1.030   pH 6.0 5.0 - 8.0   Glucose, UA NEGATIVE NEGATIVE mg/dL   Hgb urine dipstick NEGATIVE NEGATIVE   Bilirubin Urine NEGATIVE NEGATIVE   Ketones, ur 20 (A) NEGATIVE mg/dL   Protein, ur NEGATIVE NEGATIVE mg/dL   Nitrite NEGATIVE NEGATIVE   Leukocytes,Ua TRACE (A) NEGATIVE   RBC / HPF 0-5 0 - 5 RBC/hpf   WBC, UA 6-10 0 - 5 WBC/hpf   Bacteria, UA NONE SEEN NONE SEEN   Squamous Epithelial / LPF 0-5 0 - 5   Mucus PRESENT   Wet prep, genital     Status: Abnormal   Collection Time: 04/02/20  1:34 PM   Specimen: PATH Cytology Cervicovaginal Ancillary Only  Result Value Ref Range   Yeast Wet Prep HPF POC NONE SEEN NONE SEEN   Trich, Wet Prep NONE SEEN NONE SEEN   Clue Cells Wet Prep HPF POC PRESENT (A) NONE SEEN   WBC, Wet Prep HPF POC MANY (A) NONE SEEN   Sperm NONE SEEN    MAU Course  Procedures  MDM Labs ordered and reviewed. Pain consistent with RL. Discussed comfort measures. Discussed treatment for constipation. GC pending. Stable for discharge home.   Assessment and Plan   1. [redacted] weeks gestation of pregnancy   2. Pain of round ligament affecting pregnancy, antepartum   3. Slow transit constipation    Discharge home Follow up at [facility] as scheduled SAB precautions  Allergies as of 04/02/2020   No Known Allergies     Medication List    TAKE these medications   metoCLOPramide 10 MG tablet Commonly known as: REGLAN Take 1 tablet (10 mg total) by mouth every 6 (six) hours.   PrePLUS 27-1 MG Tabs Take 1 tablet by mouth daily.   valACYclovir 500 MG tablet Commonly known as: VALTREX Take 500 mg by mouth daily.      Julianne Handler, CNM 04/02/2020, 2:20 PM

## 2020-04-05 LAB — GC/CHLAMYDIA PROBE AMP (~~LOC~~) NOT AT ARMC
Chlamydia: NEGATIVE
Comment: NEGATIVE
Comment: NORMAL
Neisseria Gonorrhea: NEGATIVE

## 2020-04-08 LAB — OB RESULTS CONSOLE ANTIBODY SCREEN: Antibody Screen: NEGATIVE

## 2020-04-08 LAB — OB RESULTS CONSOLE RUBELLA ANTIBODY, IGM: Rubella: IMMUNE

## 2020-04-08 LAB — OB RESULTS CONSOLE GC/CHLAMYDIA
Chlamydia: NEGATIVE
Gonorrhea: NEGATIVE

## 2020-04-08 LAB — OB RESULTS CONSOLE HIV ANTIBODY (ROUTINE TESTING): HIV: NONREACTIVE

## 2020-04-08 LAB — OB RESULTS CONSOLE ABO/RH: RH Type: POSITIVE

## 2020-04-08 LAB — OB RESULTS CONSOLE HEPATITIS B SURFACE ANTIGEN: Hepatitis B Surface Ag: NEGATIVE

## 2020-04-08 LAB — OB RESULTS CONSOLE RPR: RPR: NONREACTIVE

## 2020-07-03 NOTE — L&D Delivery Note (Signed)
Delivery Note Labor onset:  09/11/20 Labor Onset Time: 0930 Complete dilation at 12:06 AM  Onset of pushing at 1215 FHR second stage Cat 1 Analgesia/Anesthesia intrapartum: Epidural  Guided pushing with maternal urge. Delivery of a viable female at 72. Fetal head delivered in LOA position.  Nuchal cord: none.  Infant placed on maternal abd, dried, and tactile stim.  Cord double clamped after 2 min and cut by Father, Barbaraann Rondo.  Father and paternal grandmother present for birth.  Cord blood sample collected: Yes Arterial cord blood sample collected: No  Placenta delivered Delena Bali, intact, with 3 VC.  Placenta to pathology. Uterine tone fiorm, bleeding minimal  Left vaginal laceration identified.  Anesthesia: Epidural Repair: 4-0 Vicryl SH QBL (mL): 528 Complications: none APGAR: APGAR (1 MIN): 9   APGAR (5 MINS): 9   APGAR (10 MINS):   Mom to postpartum.  Baby to Couplet care / Skin to Skin.  Arrie Eastern MSN, CNM 09/12/2020, 1:15 AM

## 2020-09-07 ENCOUNTER — Telehealth (HOSPITAL_COMMUNITY): Payer: Self-pay | Admitting: *Deleted

## 2020-09-07 ENCOUNTER — Other Ambulatory Visit: Payer: Self-pay | Admitting: Obstetrics & Gynecology

## 2020-09-07 NOTE — Telephone Encounter (Signed)
Preadmission screen  

## 2020-09-08 ENCOUNTER — Telehealth (HOSPITAL_COMMUNITY): Payer: Self-pay | Admitting: *Deleted

## 2020-09-08 NOTE — Telephone Encounter (Signed)
Preadmission screen  

## 2020-09-09 ENCOUNTER — Encounter (HOSPITAL_COMMUNITY): Payer: Self-pay | Admitting: *Deleted

## 2020-09-09 ENCOUNTER — Other Ambulatory Visit (HOSPITAL_COMMUNITY)
Admission: RE | Admit: 2020-09-09 | Discharge: 2020-09-09 | Disposition: A | Discharge status: Home / Self Care | Payer: Medicaid Other | Source: Ambulatory Visit | Attending: Obstetrics & Gynecology | Admitting: Obstetrics & Gynecology

## 2020-09-09 ENCOUNTER — Telehealth (HOSPITAL_COMMUNITY): Payer: Self-pay | Admitting: *Deleted

## 2020-09-09 DIAGNOSIS — Z20822 Contact with and (suspected) exposure to covid-19: Secondary | ICD-10-CM | POA: Insufficient documentation

## 2020-09-09 DIAGNOSIS — Z01812 Encounter for preprocedural laboratory examination: Secondary | ICD-10-CM | POA: Diagnosis present

## 2020-09-09 LAB — SARS CORONAVIRUS 2 (TAT 6-24 HRS): SARS Coronavirus 2: NEGATIVE

## 2020-09-09 NOTE — Telephone Encounter (Signed)
Preadmission screen  

## 2020-09-10 ENCOUNTER — Other Ambulatory Visit: Payer: Self-pay

## 2020-09-11 ENCOUNTER — Inpatient Hospital Stay (HOSPITAL_COMMUNITY): Payer: Medicaid Other | Admitting: Anesthesiology

## 2020-09-11 ENCOUNTER — Inpatient Hospital Stay (HOSPITAL_COMMUNITY)
Admission: AD | Admit: 2020-09-11 | Discharge: 2020-09-13 | DRG: 807 | Disposition: A | Discharge status: Home / Self Care | Payer: Medicaid Other | Attending: Obstetrics and Gynecology | Admitting: Obstetrics and Gynecology

## 2020-09-11 ENCOUNTER — Encounter (HOSPITAL_COMMUNITY): Payer: Self-pay | Admitting: Anesthesiology

## 2020-09-11 ENCOUNTER — Encounter (HOSPITAL_COMMUNITY): Payer: Self-pay | Admitting: Obstetrics & Gynecology

## 2020-09-11 ENCOUNTER — Inpatient Hospital Stay (HOSPITAL_COMMUNITY): Payer: Medicaid Other

## 2020-09-11 DIAGNOSIS — E669 Obesity, unspecified: Secondary | ICD-10-CM | POA: Diagnosis present

## 2020-09-11 DIAGNOSIS — O9902 Anemia complicating childbirth: Secondary | ICD-10-CM | POA: Diagnosis present

## 2020-09-11 DIAGNOSIS — O365930 Maternal care for other known or suspected poor fetal growth, third trimester, not applicable or unspecified: Secondary | ICD-10-CM | POA: Diagnosis present

## 2020-09-11 DIAGNOSIS — O403XX0 Polyhydramnios, third trimester, not applicable or unspecified: Principal | ICD-10-CM | POA: Diagnosis present

## 2020-09-11 DIAGNOSIS — Z3A39 39 weeks gestation of pregnancy: Secondary | ICD-10-CM | POA: Diagnosis not present

## 2020-09-11 DIAGNOSIS — O99824 Streptococcus B carrier state complicating childbirth: Secondary | ICD-10-CM | POA: Diagnosis present

## 2020-09-11 DIAGNOSIS — O99214 Obesity complicating childbirth: Secondary | ICD-10-CM | POA: Diagnosis present

## 2020-09-11 DIAGNOSIS — D509 Iron deficiency anemia, unspecified: Secondary | ICD-10-CM | POA: Diagnosis present

## 2020-09-11 DIAGNOSIS — O409XX0 Polyhydramnios, unspecified trimester, not applicable or unspecified: Secondary | ICD-10-CM | POA: Diagnosis present

## 2020-09-11 LAB — CBC
HCT: 28.8 % — ABNORMAL LOW (ref 36.0–46.0)
Hemoglobin: 9.5 g/dL — ABNORMAL LOW (ref 12.0–15.0)
MCH: 27.1 pg (ref 26.0–34.0)
MCHC: 33 g/dL (ref 30.0–36.0)
MCV: 82.1 fL (ref 80.0–100.0)
Platelets: 268 10*3/uL (ref 150–400)
RBC: 3.51 MIL/uL — ABNORMAL LOW (ref 3.87–5.11)
RDW: 14.6 % (ref 11.5–15.5)
WBC: 6.5 10*3/uL (ref 4.0–10.5)
nRBC: 0 % (ref 0.0–0.2)

## 2020-09-11 LAB — TYPE AND SCREEN
ABO/RH(D): AB POS
Antibody Screen: NEGATIVE

## 2020-09-11 LAB — RPR: RPR Ser Ql: NONREACTIVE

## 2020-09-11 MED ORDER — FENTANYL-BUPIVACAINE-NACL 0.5-0.125-0.9 MG/250ML-% EP SOLN: 12.0000 mL/h | EPIDURAL | Status: DC | PRN

## 2020-09-11 MED ORDER — SODIUM CHLORIDE 0.9 % IV SOLN
5.0000 10*6.[IU] | Freq: Once | INTRAVENOUS | Status: AC
  Administered 2020-09-11: 5 10*6.[IU] via INTRAVENOUS
  Filled 2020-09-11: qty 5

## 2020-09-11 MED ORDER — OXYTOCIN-SODIUM CHLORIDE 30-0.9 UT/500ML-% IV SOLN
1.0000 m[IU]/min | INTRAVENOUS | Status: DC
  Administered 2020-09-11: 2 m[IU]/min via INTRAVENOUS

## 2020-09-11 MED ORDER — PHENYLEPHRINE 40 MCG/ML (10ML) SYRINGE FOR IV PUSH (FOR BLOOD PRESSURE SUPPORT)
80.0000 ug | PREFILLED_SYRINGE | INTRAVENOUS | Status: DC | PRN
  Administered 2020-09-11: 80 ug via INTRAVENOUS

## 2020-09-11 MED ORDER — FENTANYL-BUPIVACAINE-NACL 0.5-0.125-0.9 MG/250ML-% EP SOLN
EPIDURAL | Status: DC | PRN
  Administered 2020-09-11: 12 mL/h via EPIDURAL

## 2020-09-11 MED ORDER — ONDANSETRON HCL 4 MG/2ML IJ SOLN
4.0000 mg | Freq: Four times a day (QID) | INTRAMUSCULAR | Status: DC | PRN
  Administered 2020-09-11: 4 mg via INTRAVENOUS
  Filled 2020-09-11: qty 2

## 2020-09-11 MED ORDER — DIPHENHYDRAMINE HCL 50 MG/ML IJ SOLN: 12.5000 mg | INTRAMUSCULAR | Status: DC | PRN

## 2020-09-11 MED ORDER — EPHEDRINE 5 MG/ML INJ: 10.0000 mg | INTRAVENOUS | Status: DC | PRN

## 2020-09-11 MED ORDER — FENTANYL-BUPIVACAINE-NACL 0.5-0.125-0.9 MG/250ML-% EP SOLN
12.0000 mL/h | EPIDURAL | Status: DC | PRN
  Filled 2020-09-11 (×2): qty 250

## 2020-09-11 MED ORDER — SOD CITRATE-CITRIC ACID 500-334 MG/5ML PO SOLN: 30.0000 mL | ORAL | Status: DC | PRN

## 2020-09-11 MED ORDER — LIDOCAINE HCL (PF) 1 % IJ SOLN
INTRAMUSCULAR | Status: DC | PRN
  Administered 2020-09-11: 6 mL via EPIDURAL

## 2020-09-11 MED ORDER — LACTATED RINGERS IV SOLN
500.0000 mL | Freq: Once | INTRAVENOUS | Status: AC
  Administered 2020-09-11: 500 mL via INTRAVENOUS

## 2020-09-11 MED ORDER — PHENYLEPHRINE 40 MCG/ML (10ML) SYRINGE FOR IV PUSH (FOR BLOOD PRESSURE SUPPORT)
80.0000 ug | PREFILLED_SYRINGE | INTRAVENOUS | Status: DC | PRN
  Filled 2020-09-11 (×2): qty 10

## 2020-09-11 MED ORDER — MISOPROSTOL 25 MCG QUARTER TABLET
25.0000 ug | ORAL_TABLET | ORAL | Status: DC | PRN
  Administered 2020-09-11 (×2): 25 ug via VAGINAL
  Filled 2020-09-11 (×3): qty 1

## 2020-09-11 MED ORDER — LACTATED RINGERS IV SOLN
500.0000 mL | INTRAVENOUS | Status: DC | PRN
  Administered 2020-09-11: 500 mL via INTRAVENOUS

## 2020-09-11 MED ORDER — OXYTOCIN BOLUS FROM INFUSION
333.0000 mL | Freq: Once | INTRAVENOUS | Status: AC
  Administered 2020-09-12: 333 mL via INTRAVENOUS

## 2020-09-11 MED ORDER — PENICILLIN G POT IN DEXTROSE 60000 UNIT/ML IV SOLN
3.0000 10*6.[IU] | INTRAVENOUS | Status: DC
  Administered 2020-09-11 (×3): 3 10*6.[IU] via INTRAVENOUS
  Filled 2020-09-11 (×4): qty 50

## 2020-09-11 MED ORDER — FENTANYL CITRATE (PF) 100 MCG/2ML IJ SOLN
50.0000 ug | INTRAMUSCULAR | Status: DC | PRN
  Administered 2020-09-11 (×4): 100 ug via INTRAVENOUS
  Filled 2020-09-11 (×4): qty 2

## 2020-09-11 MED ORDER — LIDOCAINE HCL (PF) 1 % IJ SOLN: 30.0000 mL | INTRAMUSCULAR | Status: DC | PRN

## 2020-09-11 MED ORDER — LACTATED RINGERS IV SOLN: INTRAVENOUS | Status: DC

## 2020-09-11 MED ORDER — TERBUTALINE SULFATE 1 MG/ML IJ SOLN: 0.2500 mg | Freq: Once | INTRAMUSCULAR | Status: DC | PRN

## 2020-09-11 MED ORDER — ACETAMINOPHEN 325 MG PO TABS: 650.0000 mg | ORAL_TABLET | ORAL | Status: DC | PRN

## 2020-09-11 MED ORDER — OXYTOCIN-SODIUM CHLORIDE 30-0.9 UT/500ML-% IV SOLN
2.5000 [IU]/h | INTRAVENOUS | Status: DC
  Administered 2020-09-12: 2.5 [IU]/h via INTRAVENOUS
  Filled 2020-09-11: qty 500

## 2020-09-11 NOTE — Anesthesia Preprocedure Evaluation (Signed)
Anesthesia Evaluation  Patient identified by MRN, date of birth, ID band Patient awake    Reviewed: Allergy & Precautions, NPO status , Patient's Chart, lab work & pertinent test results  History of Anesthesia Complications Negative for: history of anesthetic complications  Airway Mallampati: I  TM Distance: >3 FB Neck ROM: Full    Dental no notable dental hx. (+) Dental Advisory Given, Teeth Intact   Pulmonary neg pulmonary ROS,    Pulmonary exam normal breath sounds clear to auscultation       Cardiovascular Exercise Tolerance: Good negative cardio ROS Normal cardiovascular exam Rhythm:Regular Rate:Normal     Neuro/Psych negative neurological ROS  negative psych ROS   GI/Hepatic negative GI ROS, Neg liver ROS,   Endo/Other  obesity  Renal/GU negative Renal ROS  negative genitourinary   Musculoskeletal negative musculoskeletal ROS (+)   Abdominal (+) + obese,   Peds negative pediatric ROS (+)  Hematology negative hematology ROS (+)   Anesthesia Other Findings   Reproductive/Obstetrics (+) Pregnancy IOL for velamentous cord                             Anesthesia Physical  Anesthesia Plan  ASA: III  Anesthesia Plan: Epidural   Post-op Pain Management:    Induction:   PONV Risk Score and Plan:   Airway Management Planned:   Additional Equipment: None  Intra-op Plan:   Post-operative Plan:   Informed Consent: I have reviewed the patients History and Physical, chart, labs and discussed the procedure including the risks, benefits and alternatives for the proposed anesthesia with the patient or authorized representative who has indicated his/her understanding and acceptance.     Dental advisory given  Plan Discussed with: Anesthesiologist and CRNA  Anesthesia Plan Comments: (Labs checked- platelets confirmed with RN in room. Fetal heart tracing, per RN, reported to be  stable enough for sitting procedure. Discussed epidural, and patient consents to the procedure:  included risk of possible headache,backache, failed block, allergic reaction, and nerve injury. This patient was asked if she had any questions or concerns before the procedure started.)        Anesthesia Quick Evaluation

## 2020-09-11 NOTE — H&P (Signed)
OB ADMISSION/ HISTORY & PHYSICAL:  Admission Date: 09/11/2020 12:02 AM  Admit Diagnosis: Induction of labor for polyhydramnios   Jocelyn Lucas is a 35 y.o. female [phone] [redacted]w[redacted]d presenting for IOL for polyhydramnios. Endorses active FM, denies LOF and vaginal bleeding. Irregular ctx.  History of current pregnancy: [id number]   Primary OB Provider: [facility] Patient entered care with [facility] at 6.4 wks.   [place] 09/18/20 by [place].  Anatomy scan:  26.3 wks, complete w/ posterior placenta.   Antenatal testing: for AMA, SGA  started at 36 weeks Last evaluation: 38.3 wks  Significant prenatal events: fetal arrhythmia - NL echo, SGA, Polyhydramnios   Patient Active Problem List   Diagnosis Date Noted  . Polyhydramnios 09/11/2020  . Candida vaginitis 01/28/2020  . Vaginal bleeding affecting early pregnancy 01/28/2020  . Abdominal pain affecting pregnancy 01/28/2020  . Vaginal delivery 10/28/2014  . Positive GBS test 10/28/2014  . Velamentous insertion of umbilical cord 32/35/5732    Prenatal Labs: ABO, Rh: AB/Positive/-- (10/07 0000) Antibody: Negative (10/07 0000) Rubella: Immune (10/07 0000)  RPR: Nonreactive (10/07 0000)  HBsAg: Negative (10/07 0000)  HIV: Non-reactive (10/07 0000)  GTT: 94 GBS:   Positive (08/26/20) GC/CHL: Negative/Negative Genetics: Panorama low risk, Horizon negative Tdap/influenza vaccines: declined/declined   OB History  Gravida Para Term Preterm AB Living  7 2 2   4 2   SAB IAB Ectopic Multiple Live Births  4     0 2    # Outcome Date GA Lbr Len/2nd Weight Sex Delivery Anes PTL Lv  7 Current           6 SAB 2019          5 Term 10/28/14 [redacted]w[redacted]d 07:58 / 00:23 3470 g F Vag-Spont EPI  LIV  4 SAB 2015          3 Term 2006 [redacted]w[redacted]d  3402 g M Vag-Spont EPI  LIV  2 SAB 2005          1 SAB             Medical / Surgical History: Past medical history:  Past Medical History:  Diagnosis Date  . Anxiety   . Blood transfusion without reported diagnosis   . Fibroid    . History of postpartum hemorrhage     Past surgical history:  Past Surgical History:  Procedure Laterality Date  . NO PAST SURGERIES     Family History:  Family History  Problem Relation Age of Onset  . Hypertension Mother   . Hypertension Father   . ADD / ADHD Neg Hx   . Alcohol abuse Neg Hx   . Anxiety disorder Neg Hx   . Arthritis Neg Hx   . Asthma Neg Hx   . Birth defects Neg Hx   . Cancer Neg Hx   . COPD Neg Hx   . Depression Neg Hx   . Diabetes Neg Hx   . Drug abuse Neg Hx   . Early death Neg Hx   . Heart disease Neg Hx   . Hearing loss Neg Hx   . Intellectual disability Neg Hx   . Kidney disease Neg Hx   . Learning disabilities Neg Hx   . Miscarriages / Stillbirths Neg Hx   . Obesity Neg Hx   . Stroke Neg Hx   . Vision loss Neg Hx   . Hyperlipidemia Neg Hx   . Varicose Veins Neg Hx     Social History:  reports that she  has never smoked. She has never used smokeless tobacco. She reports previous alcohol use. She reports that she does not use drugs.  Allergies: Patient has no known allergies.   Current Medications at time of admission:  Prior to Admission medications   Medication Sig Start Date End Date Taking? Authorizing Provider  metoCLOPramide (REGLAN) 10 MG tablet Take 1 tablet (10 mg total) by mouth every 6 (six) hours. 01/28/20   Laury Deep, CNM  Prenatal Vit-Fe Fumarate-FA (PREPLUS) 27-1 MG TABS Take 1 tablet by mouth daily. 10/14/18   Lajean Manes, CNM  valACYclovir (VALTREX) 500 MG tablet Take 500 mg by mouth daily.    [provider]    Review of Systems: Constitutional: Negative   HENT: Negative   Eyes: Negative   Respiratory: Negative   Cardiovascular: Negative   Gastrointestinal: Negative  Genitourinary: negativefor bloody show, negative for LOF   Musculoskeletal: Negative   Skin: Negative   Neurological: Negative   Endo/Heme/Allergies: Negative   Psychiatric/Behavioral: Negative    Physical Exam: VS: Blood  pressure 125/69, pulse (!) 103, temperature 98 F (36.7 C), temperature source Oral, resp. rate 18, height 5\' 3"  (1.6 m), weight 94.2 kg, last menstrual period 12/11/2019, unknown if currently breastfeeding. AAO x3, no signs of distress Cardiovascular: RRR Respiratory: Lung fields clear to ausculation GU/GI: Abdomen gravid, non-tender, non-distended, active FM, vertex, no HSV lesions  Extremities: negative edema, negative for pain, tenderness, and cords  Cervical exam:  FHR: baseline rate 145 / variability moderate / accelerations absent/ absent decelerations TOCO: irregular   Prenatal Transfer Tool  Maternal Diabetes: No Genetic Screening: Normal Maternal Ultrasounds/Referrals: Normal Fetal Ultrasounds or other Referrals:  Fetal echo WNL Maternal Substance Abuse:  No Significant Maternal Medications:  Valtrex Significant Maternal Lab Results: Group B Strep positive    Assessment: 35 y.o. [id number] [redacted]w[redacted]d IOL for polyhydramnios  FHR category 1 GBS positive Pain management plan: epidural PRN   Plan:  Admit to L&D Routine admission orders Epidural PRN Sterile spec exam for Hx HSV  Will  Notified Dr Alesia Richards of admission and plan of care  Domingo Pulse MSN, CNM 09/11/2020 12:28 AM

## 2020-09-11 NOTE — Progress Notes (Signed)
Subjective:    Discussed labor plans and answered questions. Will start Pitocin.   Objective:    VS: BP (!) 101/56   Pulse 93   Temp 98.3 F (36.8 C) (Oral)   Resp 17   Ht 5\' 3"  (1.6 m)   Wt 94.2 kg   LMP 12/11/2019   BMI 36.77 kg/m  FHR : baseline 155 / variability moderate / accelerations absent / absent decelerations Toco: irreg Membranes: intact Dilation: 3 Effacement (%): 50 Station: -2 Presentation: Vertex Exam by:: Burman Foster, CNM  Assessment/Plan:   35 y.o. [phone] [redacted]w[redacted]d IOL for polyhydramnios  Labor: S/P Cytotec x2, will start Pitocin now Fetal Wellbeing:  Category I Pain Control:  plans epidural I/D:  positivem PCN x2 doses Anticipated MOD:  NSVD  Arrie Eastern MSN, CNM 09/11/2020 9:33 AM

## 2020-09-11 NOTE — Progress Notes (Signed)
Turkessa Ostrom is a 35 y.o. [id number] at [redacted]w[redacted]d admitted for IOL for polyhydramnios.  Subjective: [place] is resting comfortably. Irregular ctx. SVE 1/thick/high. Cytotec placed.  Objective: BP (!) 101/56   Pulse 93   Temp 98.3 F (36.8 C) (Oral)   Resp 17   Ht 5\' 3"  (1.6 m)   Wt 94.2 kg   LMP 12/11/2019   BMI 36.77 kg/m  No intake/output data recorded. No intake/output data recorded.  FHT:  FHR: 150 bpm, variability: moderate,  accelerations:  Abscent,  decelerations:  Absent UC:   irregular, every 1-7 minutes SVE:   Dilation: 1 Effacement (%): Thick Exam by:: k. Holhauser, CNM  Labs: Lab Results  Component Value Date   WBC 6.5 09/11/2020   HGB 9.5 (L) 09/11/2020   HCT 28.8 (L) 09/11/2020   MCV 82.1 09/11/2020   PLT 268 09/11/2020    Assessment / Plan: Induction of labor due to polyhydramnios,  progressing well on pitocin  Labor: Progressing normally  Cytotec x 2 doses Fetal Wellbeing:  Category I Pain Control:  Epidural prn I/D:  GBS positive Anticipated MOD:  NSVD  Domingo Pulse MSN, CNM 09/11/2020, 6:38 AM

## 2020-09-11 NOTE — Progress Notes (Signed)
Subjective:    Epidural placed but not effective for pain management. Epidural cath remains in place and being dosed by anesthesia. Epidural replacement offered by Dr. Ambrose Pancoast and pt declined. Pain relief options reviewed by CNM and pt states she will use IV sedation.   Objective:    VS: BP 117/69   Pulse 87   Temp 98.1 F (36.7 C) (Oral)   Resp 18   Ht 5\' 3"  (1.6 m)   Wt 94.2 kg   LMP 12/11/2019   BMI 36.77 kg/m  FHR : baseline 150 / variability moderate / accelerations absent / absent decelerations Toco: contractions every 1-2 minutes  Membranes: intact Dilation: 4 Effacement (%): 60 Station: -2, not engaged Presentation: Vertex Exam by:: Burman Foster, CNM Pitocin 1 mU/min  Assessment/Plan:   35 y.o. [id number] [redacted]w[redacted]d IOL for polyhydramnios  Labor: Progressing on Pitocin, will continue to increase then AROM w/ FSE Fetal Wellbeing:  Category I Pain Control:  IV pain meds I/D:  GBS pos, adequate GBS prophylaxis Anticipated MOD:  NSVD  Arrie Eastern MSN, CNM 37/06/2021 12:11 PM

## 2020-09-11 NOTE — Anesthesia Procedure Notes (Signed)
Epidural Patient location during procedure: OB Start time: 09/11/2020 10:13 AM End time: 09/11/2020 11:37 AM  Staffing Anesthesiologist: Janeece Riggers, MD  Preanesthetic Checklist Completed: patient identified, IV checked, site marked, risks and benefits discussed, surgical consent, monitors and equipment checked, pre-op evaluation and timeout performed  Epidural Patient position: sitting Prep: DuraPrep and site prepped and draped Patient monitoring: continuous pulse ox and blood pressure Approach: midline Location: L3-L4 Injection technique: LOR air  Needle:  Needle type: Tuohy  Needle gauge: 17 G Needle length: 9 cm and 9 Needle insertion depth: 8 cm Catheter type: closed end flexible Catheter size: 19 Gauge Catheter at skin depth: 13 cm Test dose: negative  Assessment Events: blood not aspirated, injection not painful, no injection resistance, no paresthesia and negative IV test

## 2020-09-12 ENCOUNTER — Encounter (HOSPITAL_COMMUNITY): Payer: Self-pay | Admitting: Obstetrics & Gynecology

## 2020-09-12 DIAGNOSIS — D509 Iron deficiency anemia, unspecified: Secondary | ICD-10-CM | POA: Diagnosis present

## 2020-09-12 LAB — CBC
HCT: 27.9 % — ABNORMAL LOW (ref 36.0–46.0)
Hemoglobin: 8.9 g/dL — ABNORMAL LOW (ref 12.0–15.0)
MCH: 26.3 pg (ref 26.0–34.0)
MCHC: 31.9 g/dL (ref 30.0–36.0)
MCV: 82.3 fL (ref 80.0–100.0)
Platelets: 236 10*3/uL (ref 150–400)
RBC: 3.39 MIL/uL — ABNORMAL LOW (ref 3.87–5.11)
RDW: 14.6 % (ref 11.5–15.5)
WBC: 11 10*3/uL — ABNORMAL HIGH (ref 4.0–10.5)
nRBC: 0 % (ref 0.0–0.2)

## 2020-09-12 MED ORDER — DIBUCAINE (PERIANAL) 1 % EX OINT: 1.0000 "application " | TOPICAL_OINTMENT | CUTANEOUS | Status: DC | PRN

## 2020-09-12 MED ORDER — DIPHENHYDRAMINE HCL 25 MG PO CAPS: 25.0000 mg | ORAL_CAPSULE | Freq: Four times a day (QID) | ORAL | Status: DC | PRN

## 2020-09-12 MED ORDER — ACETAMINOPHEN 325 MG PO TABS: 650.0000 mg | ORAL_TABLET | ORAL | Status: DC | PRN

## 2020-09-12 MED ORDER — IBUPROFEN 600 MG PO TABS
600.0000 mg | ORAL_TABLET | Freq: Four times a day (QID) | ORAL | Status: DC
  Administered 2020-09-12 – 2020-09-13 (×6): 600 mg via ORAL
  Filled 2020-09-12 (×6): qty 1

## 2020-09-12 MED ORDER — SENNOSIDES-DOCUSATE SODIUM 8.6-50 MG PO TABS
2.0000 | ORAL_TABLET | ORAL | Status: DC
  Administered 2020-09-13: 2 via ORAL
  Filled 2020-09-12: qty 2

## 2020-09-12 MED ORDER — SIMETHICONE 80 MG PO CHEW
80.0000 mg | CHEWABLE_TABLET | ORAL | Status: DC | PRN
  Administered 2020-09-13: 80 mg via ORAL
  Filled 2020-09-12: qty 1

## 2020-09-12 MED ORDER — VALACYCLOVIR HCL 500 MG PO TABS
500.0000 mg | ORAL_TABLET | Freq: Every day | ORAL | Status: DC
Start: 2020-09-12 — End: 2020-09-13
  Administered 2020-09-12 – 2020-09-13 (×2): 500 mg via ORAL
  Filled 2020-09-12 (×2): qty 1

## 2020-09-12 MED ORDER — ONDANSETRON HCL 4 MG/2ML IJ SOLN: 4.0000 mg | INTRAMUSCULAR | Status: DC | PRN

## 2020-09-12 MED ORDER — WITCH HAZEL-GLYCERIN EX PADS: 1.0000 "application " | MEDICATED_PAD | CUTANEOUS | Status: DC | PRN

## 2020-09-12 MED ORDER — MAGNESIUM OXIDE 400 (241.3 MG) MG PO TABS
400.0000 mg | ORAL_TABLET | Freq: Every day | ORAL | Status: DC
  Administered 2020-09-12 – 2020-09-13 (×2): 400 mg via ORAL
  Filled 2020-09-12 (×2): qty 1

## 2020-09-12 MED ORDER — PRENATAL MULTIVITAMIN CH
1.0000 | ORAL_TABLET | Freq: Every day | ORAL | Status: DC
  Administered 2020-09-12 – 2020-09-13 (×2): 1 via ORAL
  Filled 2020-09-12 (×2): qty 1

## 2020-09-12 MED ORDER — TETANUS-DIPHTH-ACELL PERTUSSIS 5-2.5-18.5 LF-MCG/0.5 IM SUSY: 0.5000 mL | PREFILLED_SYRINGE | Freq: Once | INTRAMUSCULAR | Status: DC

## 2020-09-12 MED ORDER — ONDANSETRON HCL 4 MG PO TABS: 4.0000 mg | ORAL_TABLET | ORAL | Status: DC | PRN

## 2020-09-12 MED ORDER — POLYSACCHARIDE IRON COMPLEX 150 MG PO CAPS
150.0000 mg | ORAL_CAPSULE | Freq: Every day | ORAL | Status: DC
  Administered 2020-09-12 – 2020-09-13 (×2): 150 mg via ORAL
  Filled 2020-09-12 (×2): qty 1

## 2020-09-12 MED ORDER — BENZOCAINE-MENTHOL 20-0.5 % EX AERO: 1.0000 "application " | INHALATION_SPRAY | CUTANEOUS | Status: DC | PRN

## 2020-09-12 MED ORDER — COCONUT OIL OIL
1.0000 | TOPICAL_OIL | Status: DC | PRN
Start: 2020-09-12 — End: 2020-09-13

## 2020-09-12 MED ORDER — PRENATAL MULTIVITAMIN CH: 1.0000 | ORAL_TABLET | Freq: Every day | ORAL | Status: DC

## 2020-09-12 NOTE — Progress Notes (Signed)
S: Epidural replaced and pt comfortable.   O:  FHR: Baseline 150 / variability moderate / accels present /decels absent TOCO: 2-3 min Pitocin 9 mU/min  A/P: 35 y.o. [id number] [redacted]w[redacted]d IOL for polyhydramnios  Labor: Progressing on Pitocin Fetal Wellbeing:  Category I Pain Control:  IV pain meds I/D:  GBS pos, adequate GBS prophylaxis Anticipated MOD:  NSVD

## 2020-09-12 NOTE — Anesthesia Postprocedure Evaluation (Signed)
Anesthesia Post Note  Patient: Jocelyn Lucas  Procedure(s) Performed: AN AD [place]     Patient location during evaluation: Mother Baby Anesthesia Type: Epidural Level of consciousness: awake and alert Pain management: pain level controlled Vital Signs Assessment: post-procedure vital signs reviewed and stable Respiratory status: spontaneous breathing, nonlabored ventilation and respiratory function stable Cardiovascular status: stable Postop Assessment: no headache, no backache and epidural receding Anesthetic complications: no   No complications documented.  Last Vitals:  Vitals:   09/12/20 0100 09/12/20 0130  BP: 95/63 (!) 118/57  Pulse: (!) 103 (!) 109  Resp:    Temp:  36.8 C  SpO2:      Last Pain:  Vitals:   09/12/20 0130  TempSrc: Oral  PainSc:                  ODDONO,ERNEST

## 2020-09-12 NOTE — Lactation Note (Signed)
This note was copied from a baby's chart. Lactation Consultation Note  Patient Name: Jocelyn Lucas [id number] Date: 09/12/2020 Reason for consult: Term;L&D Initial assessment Age:37 hours P3, term female infant. [place] congratulated parents on the birth of their daughter. LC entered the room, dad was dong STS with infant. Mom latched infant on her right breast using the football hold position, infant latch with depth and was still breastfeeding after 12 minutes  when LC left the room. Mom knows to breastfeed infant according to primal cues: hands in mouth, kissing, licking and rooting. LC discussed infant's input and out put with parents. Mom knows to call RN or [place] on [facility] if she needs further assistance with latching infant at the breast. Mom will need hand pump when she arrive on [facility] for prn, mom doesn't have a breast pump at home.  Maternal Data Has patient been taught Hand Expression?: Yes Does the patient have breastfeeding experience prior to this delivery?: Yes How long did the patient breastfeed?: Per mom, she brestfeed her other two children for one year each  Feeding Mother's Current Feeding Choice: Breast Milk  LATCH Score Latch: Grasps breast easily, tongue down, lips flanged, rhythmical sucking.  Audible Swallowing: A few with stimulation  Type of Nipple: Everted at rest and after stimulation  Comfort (Breast/Nipple): Soft / non-tender  Hold (Positioning): Assistance needed to correctly position infant at breast and maintain latch.  LATCH Score: 8   Lactation Tools Discussed/Used    Interventions Interventions: Breast feeding basics reviewed;Assisted with latch;Skin to skin;Breast compression;Adjust position;Support pillows;Position options;Expressed milk;Education  Discharge [place] Program: No  Consult Status Consult Status: Follow-up Date: 09/12/20 Follow-up type: In-patient    Jocelyn Lucas 09/12/2020, 1:41 AM

## 2020-09-13 MED ORDER — IBUPROFEN 600 MG PO TABS: 600.0000 mg | ORAL_TABLET | Freq: Four times a day (QID) | ORAL | 0 refills | Status: DC

## 2020-09-13 MED ORDER — POLYSACCHARIDE IRON COMPLEX 150 MG PO CAPS: 150.0000 mg | ORAL_CAPSULE | Freq: Every day | ORAL | 2 refills | Status: DC

## 2020-09-13 MED ORDER — MAGNESIUM OXIDE 400 (241.3 MG) MG PO TABS: 400.0000 mg | ORAL_TABLET | Freq: Every day | ORAL | 0 refills | Status: AC

## 2020-09-13 NOTE — Social Work (Signed)
CSW received consult for hx of Anxiety and Depression.  CSW met with MOB to offer support and complete assessment.     CSW introduced self and role. CSW observed MOB holding infant 'Brielle'. MOB was pleasant and open during assessment. CSW informed MOB of reason for consult. MOB reported she does not have a history of anxiety or depression. MOB expressed she does not know why her records would indicate she has a diagnosis of either. MOB stated she has no mental health diagnosis. MOB identified FOB, her children, mom and sisters as supports. MOB denies any current SI, HI or being involved in DV.    CSW provided education regarding the baby blues period versus PPD and provided resources. CSW provided the New Mom Checklist and encouraged MOB to self evaluate and contact a medical professional if symptoms are noted at any time.  CSW provided review of Sudden Infant Death Syndrome (SIDS) precautions. MOB reported she has all essentials needed for infant, including an infant bed. MOB has identified a pediatrician and denies any barriers to care. MOB denies having any additional needs at this time.    CSW identifies no further need for intervention and no barriers to discharge at this time.  Darra Lis, [place] [employer] [phone]

## 2020-09-13 NOTE — Discharge Summary (Signed)
Postpartum Discharge Summary  Date of Service updated 09/13/20    Patient Name: Jocelyn Lucas DOB: 24-Jun-1986 MRN: [id number]  Date of admission: 09/11/2020 Delivery date:09/12/2020  Delivering provider: Burman Foster B  Date of discharge: 09/13/2020  Admitting diagnosis: Polyhydramnios [O40.9XX0] Intrauterine pregnancy: [redacted]w[redacted]d    Secondary diagnosis:  Active Problems:   SVD (3/13)   Polyhydramnios   IDA (iron deficiency anemia)  Additional problems: none    Discharge diagnosis: Term Pregnancy Delivered                                              Post partum procedures:none Augmentation: AROM, Pitocin and Cytotec Complications: None  Hospital course: Induction of Labor With Vaginal Delivery   35y.o. yo [id number] 310w3das admitted to the hospital 09/11/2020 for induction of labor.  Indication for induction: Polyhydramnios.  Patient had an uncomplicated labor course as follows: Membrane Rupture Time/Date: 3:12 PM ,09/11/2020   Delivery Method:Vaginal, Spontaneous  Episiotomy: None  Lacerations:  1st degree  Details of delivery can be found in separate delivery note.  Patient had a routine postpartum course. Patient is discharged home 09/13/20.  Newborn Data: Birth date:09/12/2020  Birth time:12:33 AM  Gender:Female  Living status:Living  Apgars:9 ,9  Weight:2809 g   Magnesium Sulfate received: No BMZ received: No Rhophylac:N/A MMR:No T-DaP:declined Flu: No Transfusion:No  Physical exam  Vitals:   09/12/20 1016 09/12/20 1400 09/12/20 2107 09/13/20 0607  BP: 110/67 108/67 107/68 101/72  Pulse: 90 91 98 81  Resp:  14 16 18   Temp: 98 F (36.7 C) 98.1 F (36.7 C) 98.3 F (36.8 C) 98.2 F (36.8 C)  TempSrc:  Oral Oral Oral  SpO2: 100% 100% 99% 100%  Weight:      Height:       General: alert, cooperative and no distress Lochia: appropriate Uterine Fundus: firm Perineum: well-approximated, non-edematous DVT Evaluation: No evidence of DVT seen on  physical exam. No cords or calf tenderness. No significant calf/ankle edema. Labs: Lab Results  Component Value Date   WBC 11.0 (H) 09/12/2020   HGB 8.9 (L) 09/12/2020   HCT 27.9 (L) 09/12/2020   MCV 82.3 09/12/2020   PLT 236 09/12/2020   CMP Latest Ref Rng & Units 05/18/2018  Glucose 70 - 99 mg/dL 98  BUN 6 - 20 mg/dL 12  Creatinine 0.44 - 1.00 mg/dL 0.93  Sodium 135 - 145 mmol/L 133(L)  Potassium 3.5 - 5.1 mmol/L 3.1(L)  Chloride 98 - 111 mmol/L 103  CO2 22 - 32 mmol/L 22  Calcium 8.9 - 10.3 mg/dL 8.6(L)  Total Protein 6.5 - 8.1 g/dL 6.2(L)  Total Bilirubin 0.3 - 1.2 mg/dL 0.5  Alkaline Phos 38 - 126 U/L 39  AST 15 - 41 U/L 18  ALT 0 - 44 U/L 11   Edinburgh Score: Edinburgh Postnatal Depression Scale Screening Tool 09/13/2020  I have been able to laugh and see the funny side of things. 0  I have looked forward with enjoyment to things. 0  I have blamed myself unnecessarily when things went wrong. 0  I have been anxious or worried for no good reason. 0  I have felt scared or panicky for no good reason. 0  Things have been getting on top of me. 1  I have been so unhappy that I have had difficulty sleeping. 0  I have felt sad or miserable. 0  I have been so unhappy that I have been crying. 0  The thought of harming myself has occurred to me. 0  Edinburgh Postnatal Depression Scale Total 1      After visit meds:  Allergies as of 09/13/2020   No Known Allergies     Medication List    STOP taking these medications   valACYclovir 500 MG tablet Commonly known as: VALTREX     TAKE these medications   ibuprofen 600 MG tablet Commonly known as: ADVIL Take 1 tablet (600 mg total) by mouth every 6 (six) hours.   iron polysaccharides 150 MG capsule Commonly known as: NIFEREX Take 1 capsule (150 mg total) by mouth daily. Start taking on: September 14, 2020   magnesium oxide 400 (241.3 Mg) MG tablet Commonly known as: MAG-OX Take 1 tablet (400 mg total) by mouth  daily. Start taking on: September 14, 2020   prenatal multivitamin Tabs tablet Take 1 tablet by mouth daily at 12 noon.        Discharge home in stable condition Infant Feeding: Breast Infant Disposition:home with mother Discharge instruction: per After Visit Summary and Postpartum booklet. Activity: Advance as tolerated. Pelvic rest for 6 weeks.  Diet: iron rich diet Anticipated Birth Control: Patch Postpartum Appointment:6 weeks Additional Postpartum F/U: none Future Appointments:No future appointments. Follow up Visit:  [facility]. Schedule an appointment as soon as possible for a visit in 6 week(s).   Specialty: Obstetrics and Gynecology Contact information: [address] [place] [address] [phone]                  09/13/2020 Arrie Eastern, CNM

## 2020-09-14 LAB — SURGICAL PATHOLOGY

## 2020-10-22 ENCOUNTER — Telehealth (HOSPITAL_COMMUNITY): Payer: Self-pay | Admitting: Lactation Services

## 2020-11-16 IMAGING — US US OB < 14 WEEKS - US OB TV
1 series · 15 of 28 positions shown · non-contrast
Comparison: None.

CLINICAL DATA: Pelvic pain with vaginal spotting

EXAM:
OBSTETRIC <14 WK US AND TRANSVAGINAL OB US
TECHNIQUE: Both transabdominal and transvaginal ultrasound examinations were
performed for complete evaluation of the gestation as well as the
maternal uterus, adnexal regions, and pelvic cul-de-sac.
Transvaginal technique was performed to assess early pregnancy.

[Series 1: us ob < 14 weeks - us ob tv · 15 of 92 slices shown]
[im 1/92]
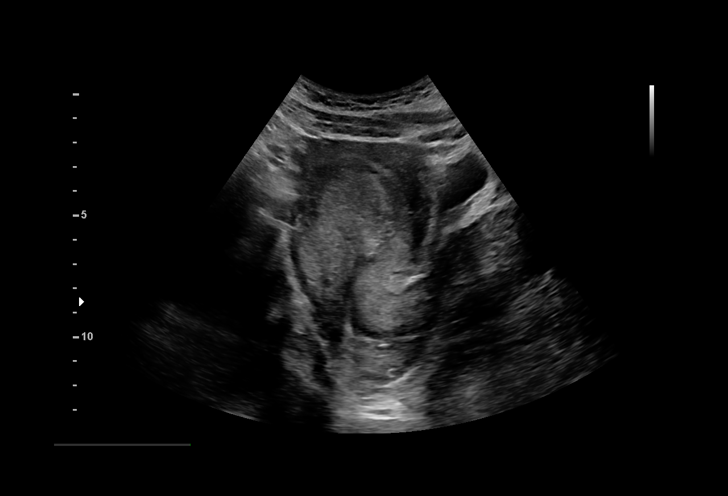
[im 7/92]
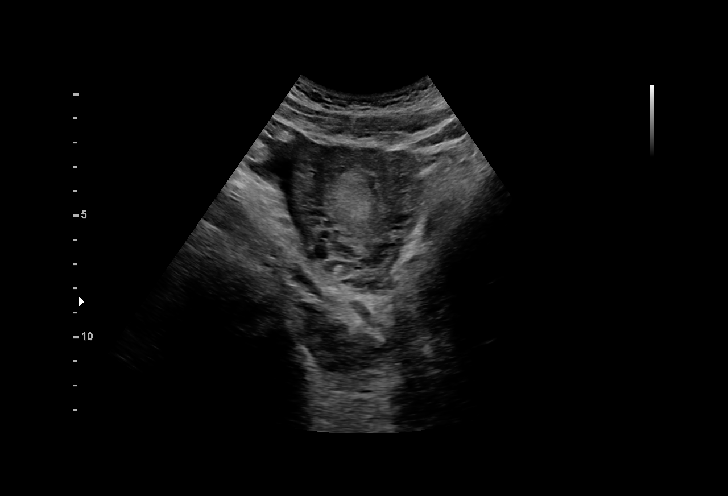
[im 14/92]
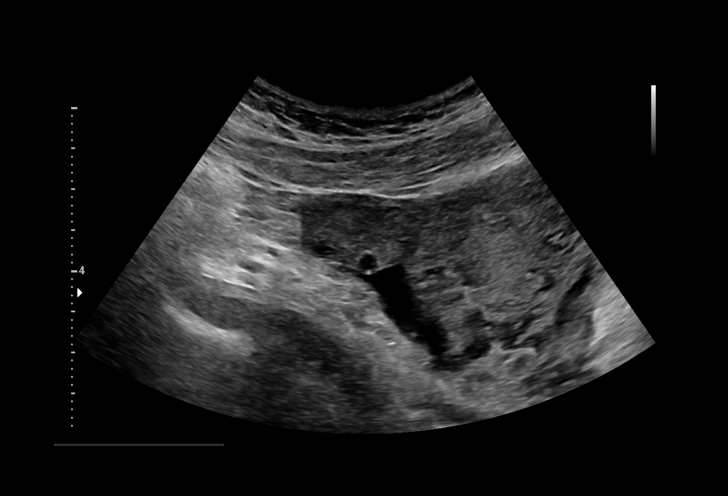
[im 21/92]
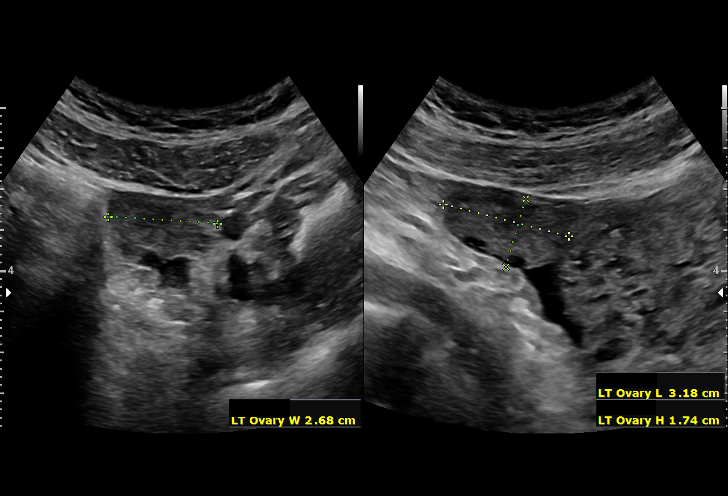
[im 27/92]
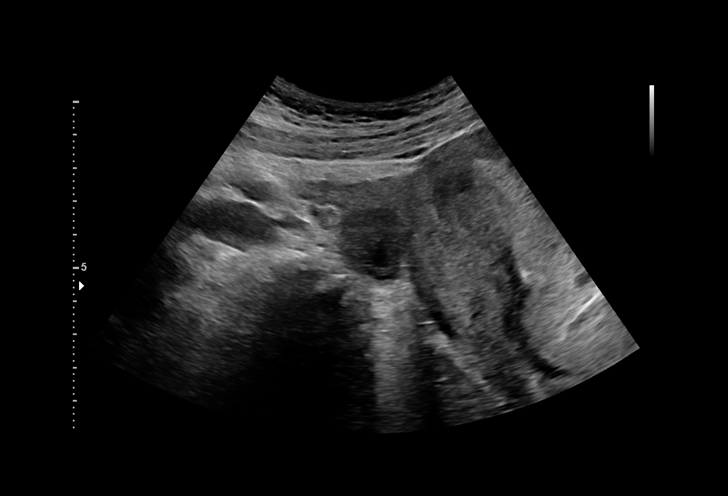
[im 34/92]
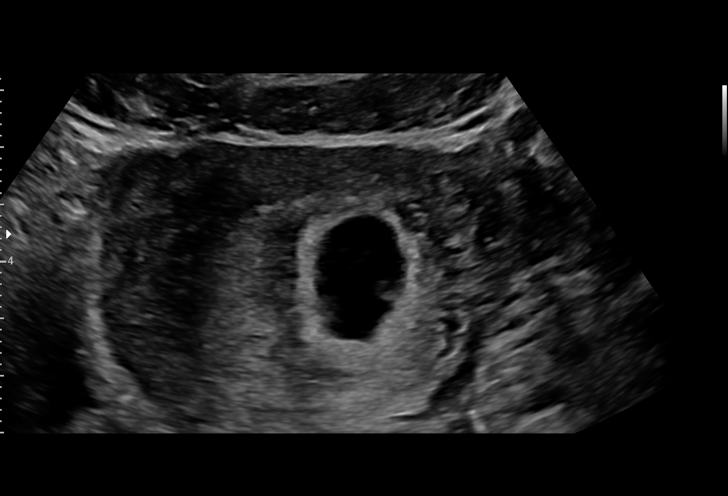
[im 41/92]
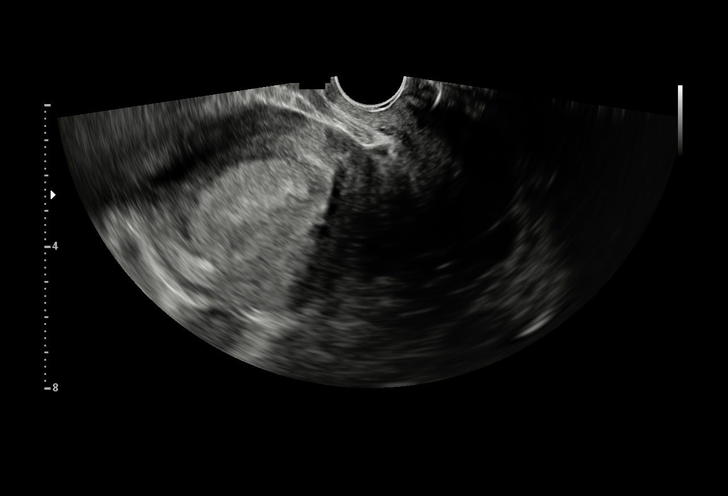
[im 48/92]
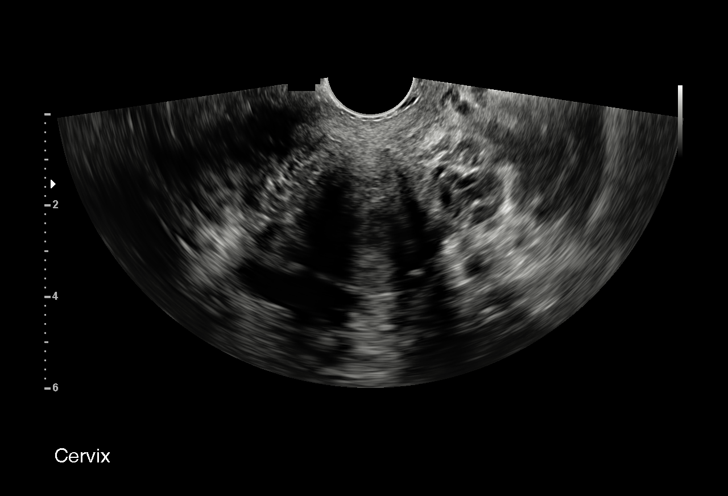
[im 51/92]
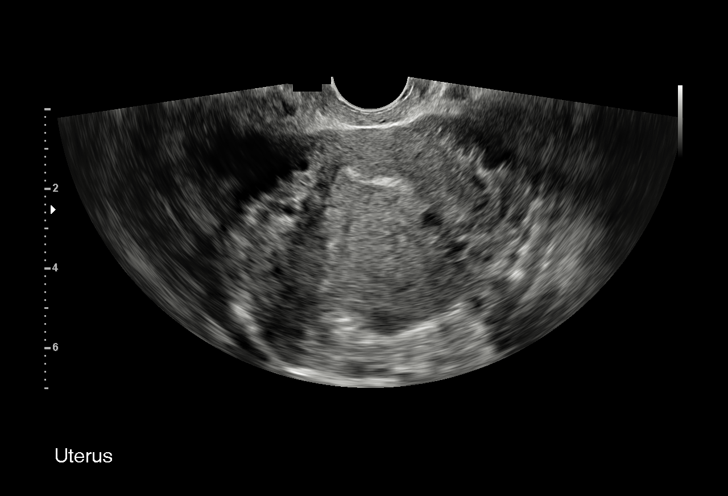
[im 58/92]
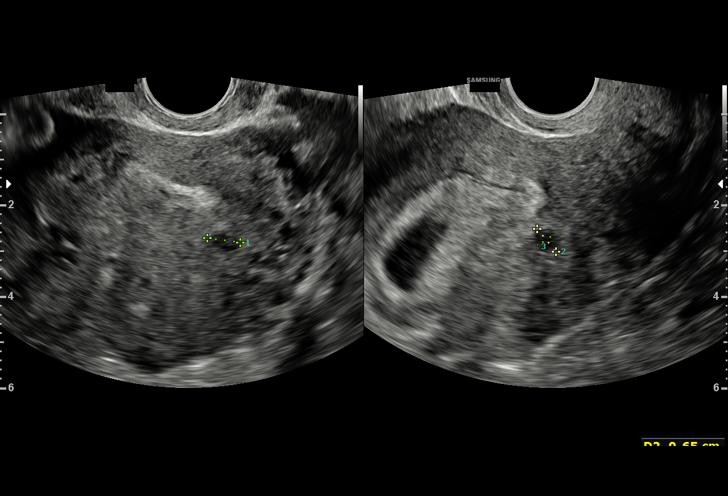
[im 65/92]
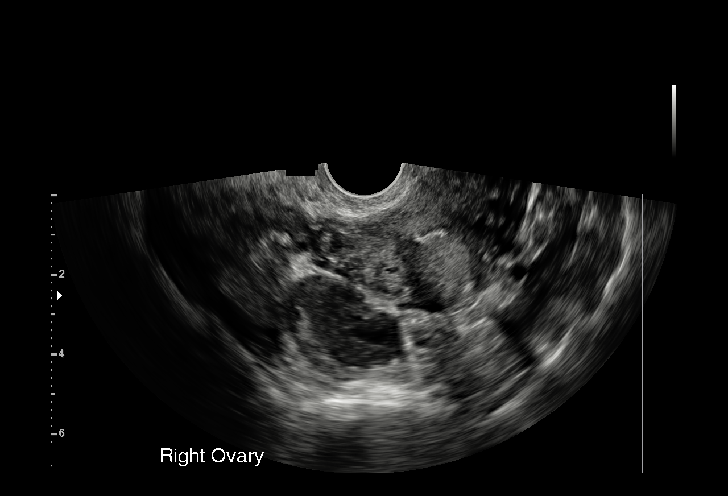
[im 71/92]
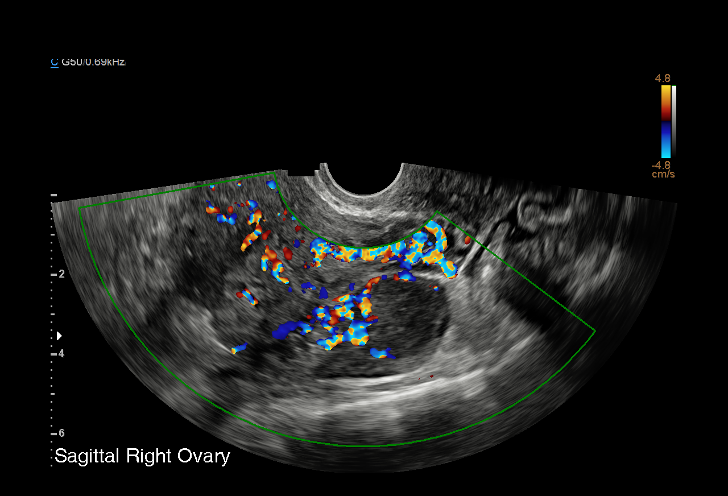
[im 78/92]
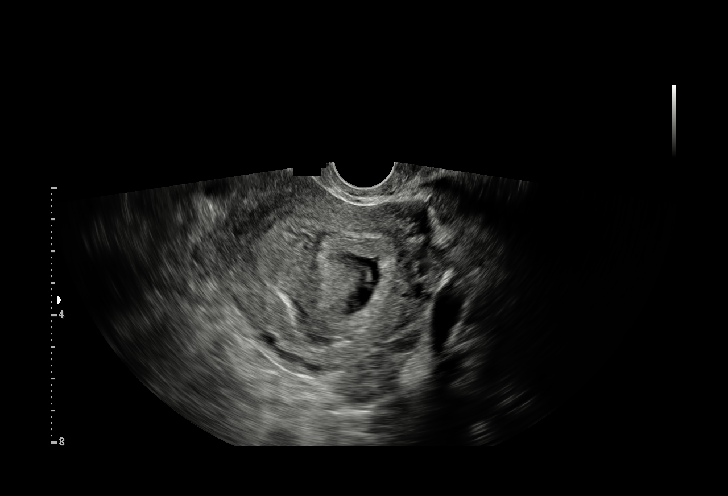
[im 85/92]
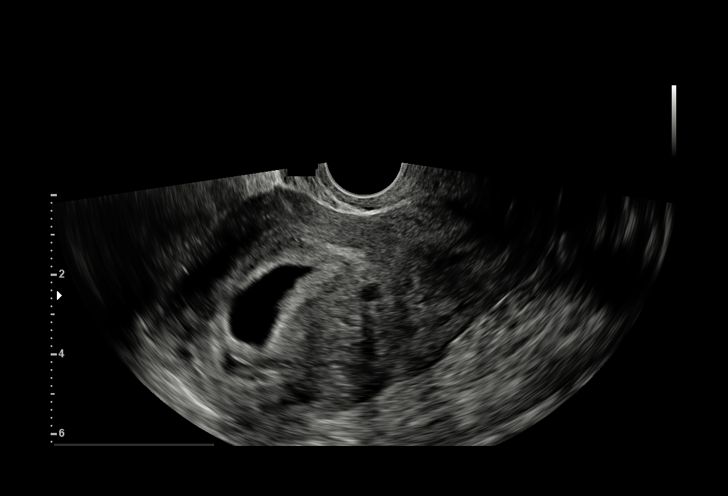
[im 92/92]
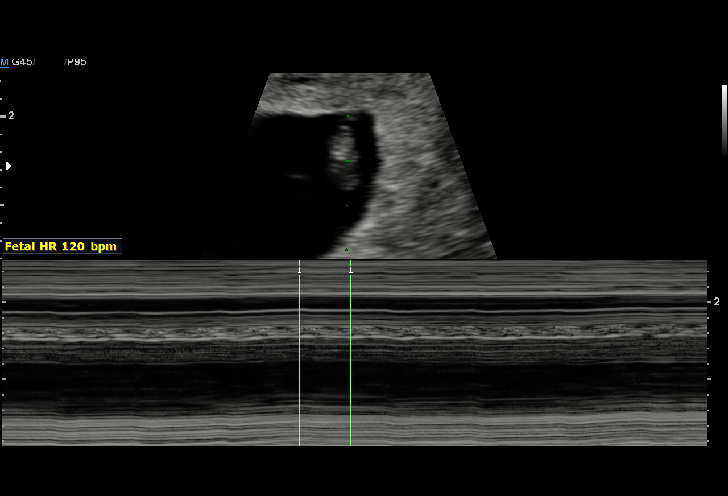

[15 of 28 positions shown; findings below may reference images not displayed]

FINDINGS: Intrauterine gestational sac: Visualized

Yolk sac:  Visualized

Embryo:  Visualized

Cardiac Activity: Visualized

Heart Rate: 122 bpm

CRL:  8 mm   6 w   4 d                  US EDC: September 18, 2020

Subchorionic hemorrhage: There is a subchorionic hemorrhage
measuring 2.2 x 0.7 cm.

Maternal uterus/adnexae: Cervical os closed. There is a leiomyoma in
the lower uterine segment measuring 0.7 x 0.7 x 0.6 cm. Toward the
right more superiorly in the uterus, there is a 1.2 x 1.1 x 0.9 cm
leiomyoma.

Right ovary measures 3.9 x 2.5 x 3.2 cm. Left ovary measures 4.8 x
1.8 x 1.8 cm. No extrauterine pelvic mass. No appreciable free
pelvic fluid.
IMPRESSION: 1. Single live intrauterine gestation with estimated gestational age
of 6+ weeks.

2.  Subchorionic hemorrhage measuring 2.2 x 0.7 cm.

3.  Small intrauterine leiomyomas as noted.

4.  No extrauterine pelvic mass or appreciable free pelvic fluid.

## 2021-12-20 LAB — OB RESULTS CONSOLE HEPATITIS B SURFACE ANTIGEN: Hepatitis B Surface Ag: NEGATIVE

## 2021-12-20 LAB — OB RESULTS CONSOLE HIV ANTIBODY (ROUTINE TESTING): HIV: NONREACTIVE

## 2021-12-20 LAB — OB RESULTS CONSOLE RUBELLA ANTIBODY, IGM: Rubella: IMMUNE

## 2021-12-20 LAB — OB RESULTS CONSOLE RPR: RPR: NONREACTIVE

## 2021-12-20 LAB — OB RESULTS CONSOLE ANTIBODY SCREEN: Antibody Screen: NEGATIVE

## 2021-12-20 LAB — OB RESULTS CONSOLE ABO/RH: RH Type: POSITIVE

## 2021-12-20 LAB — HEPATITIS C ANTIBODY: HCV Ab: NEGATIVE

## 2022-01-19 ENCOUNTER — Encounter (HOSPITAL_COMMUNITY): Payer: Self-pay | Admitting: Obstetrics & Gynecology

## 2022-01-19 ENCOUNTER — Inpatient Hospital Stay (HOSPITAL_COMMUNITY)
Admission: AD | Admit: 2022-01-19 | Discharge: 2022-01-19 | Disposition: A | Discharge status: Home / Self Care | Payer: Medicaid Other | Attending: Obstetrics & Gynecology | Admitting: Obstetrics & Gynecology

## 2022-01-19 ENCOUNTER — Other Ambulatory Visit: Payer: Self-pay

## 2022-01-19 DIAGNOSIS — O09523 Supervision of elderly multigravida, third trimester: Secondary | ICD-10-CM | POA: Diagnosis not present

## 2022-01-19 DIAGNOSIS — Z3A31 31 weeks gestation of pregnancy: Secondary | ICD-10-CM | POA: Diagnosis not present

## 2022-01-19 DIAGNOSIS — O26893 Other specified pregnancy related conditions, third trimester: Secondary | ICD-10-CM | POA: Insufficient documentation

## 2022-01-19 DIAGNOSIS — R1032 Left lower quadrant pain: Secondary | ICD-10-CM | POA: Diagnosis not present

## 2022-01-19 DIAGNOSIS — O09293 Supervision of pregnancy with other poor reproductive or obstetric history, third trimester: Secondary | ICD-10-CM | POA: Diagnosis not present

## 2022-01-19 DIAGNOSIS — R102 Pelvic and perineal pain: Secondary | ICD-10-CM | POA: Diagnosis not present

## 2022-01-19 DIAGNOSIS — O26899 Other specified pregnancy related conditions, unspecified trimester: Secondary | ICD-10-CM

## 2022-01-19 LAB — URINALYSIS, ROUTINE W REFLEX MICROSCOPIC
Bilirubin Urine: NEGATIVE
Glucose, UA: NEGATIVE mg/dL
Hgb urine dipstick: NEGATIVE
Ketones, ur: 5 mg/dL — AB
Nitrite: NEGATIVE
Protein, ur: 30 mg/dL — AB
Specific Gravity, Urine: 1.029 (ref 1.005–1.030)
Squamous Epithelial / HPF: >50 — ABNORMAL HIGH (ref 0–5)
pH: 6 (ref 5.0–8.0)

## 2022-01-19 NOTE — MAU Note (Signed)
..  Jocelyn Lucas is a 36 y.o. at 35w5dhere in MAU reporting: Around 9:30pm she had a sharp pain in her lower abdomen and since then she has had pelvic pressure.  Denies vaginal bleeding or leaking of fluid. +FM. Denies complications in pregnancy   Onset of complaint: 9:30 pm Pain score: 7/10  Lab orders placed from triage: UA

## 2022-01-19 NOTE — MAU Provider Note (Signed)
Chief Complaint:  Abdominal Pain   Event Date/Time   First Provider Initiated Contact with Patient 01/19/22 2215      HPI: Jocelyn Lucas is a 36 y.o. [id number] at 78w5dwho presents to maternity admissions reporting a sharp LLQ pain at 9:30 pm followed by an increase in the pelvic pressure that started 3 weeks ago.  She was putting together a scooter when the pain occurred but was sitting down, not standing or lifting anything.  She reports pelvic pressure with this pregnancy, mostly at the pubic symphysis but it seems worse tonight after the other pain occurred.   She reports good fetal movement, denies LOF or vaginal bleeding.    HPI  Past Medical History: Past Medical History:  Diagnosis Date   Anxiety    Blood transfusion without reported diagnosis    Fibroid    History of postpartum hemorrhage     Past obstetric history: OB History  Gravida Para Term Preterm AB Living  '8 3 3   4 3  '$ SAB IAB Ectopic Multiple Live Births  4     0 3    # Outcome Date GA Lbr Len/2nd Weight Sex Delivery Anes PTL Lv  8 Current           7 Term 09/12/20 386w3d 00:26 2809 g F Vag-Spont EPI  LIV  6 SAB 2019          5 Term 10/28/14 4039w1d:58 / 00:23 3470 g F Vag-Spont EPI  LIV  4 SAB 2015          3 Term 2006 41w78w0d02 g M Vag-Spont EPI  LIV  2 SAB 2005          1 SAB             Past Surgical History: Past Surgical History:  Procedure Laterality Date   NO PAST SURGERIES      Family History: Family History  Problem Relation Age of Onset   Hypertension Mother    Hypertension Father    ADD / ADHD Neg Hx    Alcohol abuse Neg Hx    Anxiety disorder Neg Hx    Arthritis Neg Hx    Asthma Neg Hx    Birth defects Neg Hx    Cancer Neg Hx    COPD Neg Hx    Depression Neg Hx    Diabetes Neg Hx    Drug abuse Neg Hx    Early death Neg Hx    Heart disease Neg Hx    Hearing loss Neg Hx    Intellectual disability Neg Hx    Kidney disease Neg Hx    Learning disabilities Neg Hx     Miscarriages / Stillbirths Neg Hx    Obesity Neg Hx    Stroke Neg Hx    Vision loss Neg Hx    Hyperlipidemia Neg Hx    Varicose Veins Neg Hx     Social History: Social History   Tobacco Use   Smoking status: Never   Smokeless tobacco: Never  Vaping Use   Vaping Use: Never used  Substance Use Topics   Alcohol use: Not Currently    Comment: occ   Drug use: No    Allergies: No Known Allergies  Meds:  Medications Prior to Admission  Medication Sig Dispense Refill Last Dose   Prenatal Vit-Fe Fumarate-FA (PRENATAL MULTIVITAMIN) TABS tablet Take 1 tablet by mouth daily at 12 noon.   01/19/2022  ibuprofen (ADVIL) 600 MG tablet Take 1 tablet (600 mg total) by mouth every 6 (six) hours. 30 tablet 0    iron polysaccharides (NIFEREX) 150 MG capsule Take 1 capsule (150 mg total) by mouth daily. 30 capsule 2     ROS:  Review of Systems  Constitutional:  Negative for chills, fatigue and fever.  Eyes:  Negative for visual disturbance.  Respiratory:  Negative for shortness of breath.   Cardiovascular:  Negative for chest pain.  Gastrointestinal:  Positive for abdominal pain. Negative for nausea and vomiting.  Genitourinary:  Negative for difficulty urinating, dysuria, flank pain, pelvic pain, vaginal bleeding, vaginal discharge and vaginal pain.  Neurological:  Negative for dizziness and headaches.  Psychiatric/Behavioral: Negative.       I have reviewed patient's Past Medical Hx, Surgical Hx, Family Hx, Social Hx, medications and allergies.   Physical Exam  Patient Vitals for the past 24 hrs:  BP Temp Temp src Pulse Resp SpO2 Height Weight  01/19/22 2253 125/67 -- -- (!) 106 -- -- -- --  01/19/22 2250 -- -- -- -- -- 100 % -- --  01/19/22 2213 120/60 97.6 F (36.4 C) Oral 96 18 -- -- --  01/19/22 2205 -- -- -- -- -- -- '5\' 3"'$  (1.6 m) 95.7 kg   Constitutional: Well-developed, well-nourished female in no acute distress.  Cardiovascular: normal rate Respiratory: normal  effort GI: Abd soft, non-tender, gravid appropriate for gestational age.  MS: Extremities nontender, no edema, normal ROM Neurologic: Alert and oriented x 4.  GU: Neg CVAT.  PELVIC EXAM:   Dilation: Closed Effacement (%): Thick Cervical Position: Posterior Exam by:: Fatima Blank, CNM  FHT:  Baseline 145 , moderate variability, accelerations present, no decelerations Contractions: none on toco or to palpation   Labs: No results found for this or any previous visit (from the past 24 hour(s)).    Imaging:  No results found.  MAU Course/MDM: Orders Placed This Encounter  Procedures   Urinalysis, Routine w reflex microscopic Urine, Clean Catch   Discharge patient    No orders of the defined types were placed in this encounter.    NST reviewed and reactive Cervix closed and long, no evidence of PTL, FFN collected but not sent with closed cervix Rest/ice/heat/warm bath/increase PO fluids/Tylenol/pregnancy support belt for pain PTL precautions reviewed Keep scheduled prenatal appts    Assessment: 1. Pain of round ligament affecting pregnancy, antepartum   2. Pelvic pain affecting pregnancy in third trimester, antepartum   3. [redacted] weeks gestation of pregnancy     Plan: Discharge home Labor precautions and fetal kick counts  Follow-up [facility] Follow up.   Specialty: Obstetrics and Gynecology Why: As scheduled Contact information: [address]. [address] [place] [address] [phone]        [facility] Follow up.   Specialty: Obstetrics and Gynecology Contact information: [address] [id number] [place] [facility] Follow up.   Specialty: Obstetrics and Gynecology Why: As needed for emergencies Contact information: [address] [id number] [place] [phone]               Allergies as of 01/19/2022   No Known Allergies      Medication List     STOP taking these medications    ibuprofen 600 MG tablet Commonly known as:  ADVIL       TAKE these medications    iron polysaccharides 150 MG capsule Commonly known as: NIFEREX Take 1 capsule (150 mg total) by mouth daily.   prenatal multivitamin Tabs tablet Take 1 tablet by mouth daily at 12 noon.        Fatima Blank Certified Nurse-Midwife 01/19/2022 11:07 PM

## 2022-02-21 LAB — OB RESULTS CONSOLE GBS: GBS: POSITIVE

## 2022-03-20 ENCOUNTER — Other Ambulatory Visit: Payer: Self-pay | Admitting: Obstetrics and Gynecology

## 2022-03-20 ENCOUNTER — Encounter (HOSPITAL_COMMUNITY): Payer: Self-pay | Admitting: Obstetrics and Gynecology

## 2022-03-20 NOTE — Patient Instructions (Addendum)
Karson Chicas  03/20/2022   Your procedure is scheduled on:  03/21/2022  Arrive at 3 at Entrance C on [address] [facility]  and [employer]. You are invited to use the FREE valet parking or use the Visitor's parking deck.  Pick up the phone at the desk and dial [phone].  Call this number if you have problems the morning of surgery: [phone]  Remember:   Do not eat food:(After Midnight) Desps de medianoche.  Do not drink clear liquids: (After Midnight) Desps de medianoche.  Take these medicines the morning of surgery with A SIP OF WATER:  valtrex   Do not wear jewelry, make-up or nail polish.  Do not wear lotions, powders, or perfumes. Do not wear deodorant.  Do not shave 48 hours prior to surgery.  Do not bring valuables to the hospital.  [facility] is not   responsible for any belongings or valuables brought to the hospital.  Contacts, dentures or bridgework may not be worn into surgery.  Leave suitcase in the car. After surgery it may be brought to your room.  For patients admitted to the hospital, checkout time is 11:00 AM the day of              discharge.      Please read over the following fact sheets that you were given:     Preparing for Surgery

## 2022-03-21 ENCOUNTER — Other Ambulatory Visit: Payer: Self-pay

## 2022-03-21 ENCOUNTER — Inpatient Hospital Stay (HOSPITAL_COMMUNITY)
Admission: RE | Admit: 2022-03-21 | Discharge: 2022-03-24 | DRG: 784 | Disposition: A | Discharge status: Home / Self Care | Payer: Medicaid Other | Source: Ambulatory Visit | Attending: Obstetrics and Gynecology | Admitting: Obstetrics and Gynecology

## 2022-03-21 ENCOUNTER — Encounter (HOSPITAL_COMMUNITY)
Admission: RE | Disposition: A | Discharge status: Home / Self Care | Payer: Self-pay | Source: Ambulatory Visit | Attending: Obstetrics and Gynecology

## 2022-03-21 ENCOUNTER — Inpatient Hospital Stay (HOSPITAL_COMMUNITY): Payer: Medicaid Other | Admitting: Anesthesiology

## 2022-03-21 ENCOUNTER — Encounter (HOSPITAL_COMMUNITY): Payer: Self-pay | Admitting: Obstetrics and Gynecology

## 2022-03-21 DIAGNOSIS — O9852 Other viral diseases complicating childbirth: Secondary | ICD-10-CM | POA: Diagnosis not present

## 2022-03-21 DIAGNOSIS — B009 Herpesviral infection, unspecified: Secondary | ICD-10-CM | POA: Diagnosis not present

## 2022-03-21 DIAGNOSIS — A6000 Herpesviral infection of urogenital system, unspecified: Secondary | ICD-10-CM | POA: Diagnosis present

## 2022-03-21 DIAGNOSIS — O9081 Anemia of the puerperium: Secondary | ICD-10-CM | POA: Diagnosis not present

## 2022-03-21 DIAGNOSIS — O9832 Other infections with a predominantly sexual mode of transmission complicating childbirth: Secondary | ICD-10-CM | POA: Diagnosis present

## 2022-03-21 DIAGNOSIS — O99824 Streptococcus B carrier state complicating childbirth: Secondary | ICD-10-CM | POA: Diagnosis present

## 2022-03-21 DIAGNOSIS — D62 Acute posthemorrhagic anemia: Secondary | ICD-10-CM | POA: Diagnosis not present

## 2022-03-21 DIAGNOSIS — Z3A40 40 weeks gestation of pregnancy: Secondary | ICD-10-CM | POA: Diagnosis not present

## 2022-03-21 DIAGNOSIS — O480 Post-term pregnancy: Secondary | ICD-10-CM | POA: Diagnosis present

## 2022-03-21 DIAGNOSIS — Z98891 History of uterine scar from previous surgery: Principal | ICD-10-CM

## 2022-03-21 DIAGNOSIS — Z3A31 31 weeks gestation of pregnancy: Secondary | ICD-10-CM | POA: Diagnosis not present

## 2022-03-21 DIAGNOSIS — Z302 Encounter for sterilization: Secondary | ICD-10-CM | POA: Diagnosis not present

## 2022-03-21 DIAGNOSIS — Z3A39 39 weeks gestation of pregnancy: Secondary | ICD-10-CM | POA: Diagnosis not present

## 2022-03-21 LAB — CBC
HCT: 26.4 % — ABNORMAL LOW (ref 36.0–46.0)
Hemoglobin: 8.5 g/dL — ABNORMAL LOW (ref 12.0–15.0)
MCH: 24.6 pg — ABNORMAL LOW (ref 26.0–34.0)
MCHC: 32.2 g/dL (ref 30.0–36.0)
MCV: 76.3 fL — ABNORMAL LOW (ref 80.0–100.0)
Platelets: 277 10*3/uL (ref 150–400)
RBC: 3.46 MIL/uL — ABNORMAL LOW (ref 3.87–5.11)
RDW: 16.4 % — ABNORMAL HIGH (ref 11.5–15.5)
WBC: 7 10*3/uL (ref 4.0–10.5)
nRBC: 0 % (ref 0.0–0.2)

## 2022-03-21 LAB — BASIC METABOLIC PANEL
Anion gap: 9 (ref 5–15)
BUN: 5 mg/dL — ABNORMAL LOW (ref 6–20)
CO2: 21 mmol/L — ABNORMAL LOW (ref 22–32)
Calcium: 8.8 mg/dL — ABNORMAL LOW (ref 8.9–10.3)
Chloride: 106 mmol/L (ref 98–111)
Creatinine, Ser: 0.58 mg/dL (ref 0.44–1.00)
GFR, Estimated: >60 mL/min (ref >60)
Glucose, Bld: 79 mg/dL (ref 70–99)
Potassium: 3.7 mmol/L (ref 3.5–5.1)
Sodium: 136 mmol/L (ref 135–145)

## 2022-03-21 LAB — RPR: RPR Ser Ql: NONREACTIVE

## 2022-03-21 LAB — POCT I-STAT EG7
Acid-base deficit: 7 mmol/L — ABNORMAL HIGH (ref 0.0–2.0)
Bicarbonate: 18.5 mmol/L — ABNORMAL LOW (ref 20.0–28.0)
Calcium, Ion: 1.23 mmol/L (ref 1.15–1.40)
HCT: 21 % — ABNORMAL LOW (ref 36.0–46.0)
Hemoglobin: 7.1 g/dL — ABNORMAL LOW (ref 12.0–15.0)
O2 Saturation: 79 %
Potassium: 3.6 mmol/L (ref 3.5–5.1)
Sodium: 136 mmol/L (ref 135–145)
TCO2: 19 mmol/L — ABNORMAL LOW (ref 22–32)
pCO2, Ven: 33.7 mmHg — ABNORMAL LOW (ref 44–60)
pH, Ven: 7.346 (ref 7.25–7.43)
pO2, Ven: 45 mmHg (ref 32–45)

## 2022-03-21 LAB — PREPARE RBC (CROSSMATCH)

## 2022-03-21 SURGERY — Surgical Case
Anesthesia: Spinal

## 2022-03-21 MED ORDER — STERILE WATER FOR IRRIGATION IR SOLN
Status: DC | PRN
  Administered 2022-03-21: 1

## 2022-03-21 MED ORDER — KETOROLAC TROMETHAMINE 30 MG/ML IJ SOLN: 30.0000 mg | Freq: Four times a day (QID) | INTRAMUSCULAR | Status: AC | PRN

## 2022-03-21 MED ORDER — CEFAZOLIN SODIUM-DEXTROSE 2-4 GM/100ML-% IV SOLN
INTRAVENOUS | Status: AC
  Filled 2022-03-21: qty 100

## 2022-03-21 MED ORDER — SODIUM CHLORIDE 0.9 % IR SOLN
Status: DC | PRN
  Administered 2022-03-21 (×4): 1

## 2022-03-21 MED ORDER — LACTATED RINGERS IV BOLUS
1000.0000 mL | Freq: Once | INTRAVENOUS | Status: AC
  Administered 2022-03-21: 1000 mL via INTRAVENOUS

## 2022-03-21 MED ORDER — CHLORHEXIDINE GLUCONATE CLOTH 2 % EX PADS: 2.0000 | MEDICATED_PAD | Freq: Every day | CUTANEOUS | Status: DC

## 2022-03-21 MED ORDER — DEXAMETHASONE SODIUM PHOSPHATE 4 MG/ML IJ SOLN
INTRAMUSCULAR | Status: AC
  Filled 2022-03-21: qty 1

## 2022-03-21 MED ORDER — PRENATAL MULTIVITAMIN CH
1.0000 | ORAL_TABLET | Freq: Every day | ORAL | Status: DC
  Administered 2022-03-22 – 2022-03-24 (×3): 1 via ORAL
  Filled 2022-03-21 (×3): qty 1

## 2022-03-21 MED ORDER — PROMETHAZINE HCL 25 MG/ML IJ SOLN: 6.2500 mg | INTRAMUSCULAR | Status: DC | PRN

## 2022-03-21 MED ORDER — MORPHINE SULFATE (PF) 0.5 MG/ML IJ SOLN
INTRAMUSCULAR | Status: DC | PRN
  Administered 2022-03-21: 150 ug via INTRATHECAL

## 2022-03-21 MED ORDER — KETOROLAC TROMETHAMINE 30 MG/ML IJ SOLN
30.0000 mg | Freq: Four times a day (QID) | INTRAMUSCULAR | Status: AC | PRN
  Administered 2022-03-22: 30 mg via INTRAVENOUS
  Filled 2022-03-21: qty 1

## 2022-03-21 MED ORDER — COCONUT OIL OIL: 1.0000 | TOPICAL_OIL | Status: DC | PRN

## 2022-03-21 MED ORDER — PHENYLEPHRINE HCL (PRESSORS) 10 MG/ML IV SOLN
INTRAVENOUS | Status: DC | PRN
  Administered 2022-03-21: 240 ug via INTRAVENOUS
  Administered 2022-03-21 (×3): 80 ug via INTRAVENOUS
  Administered 2022-03-21: 240 ug via INTRAVENOUS
  Administered 2022-03-21: 160 ug via INTRAVENOUS
  Administered 2022-03-21: 80 ug via INTRAVENOUS

## 2022-03-21 MED ORDER — OXYTOCIN-SODIUM CHLORIDE 30-0.9 UT/500ML-% IV SOLN
INTRAVENOUS | Status: AC
  Filled 2022-03-21: qty 500

## 2022-03-21 MED ORDER — ONDANSETRON HCL 4 MG/2ML IJ SOLN
INTRAMUSCULAR | Status: DC | PRN
  Administered 2022-03-21: 4 mg via INTRAVENOUS

## 2022-03-21 MED ORDER — BUPIVACAINE IN DEXTROSE 0.75-8.25 % IT SOLN
INTRATHECAL | Status: DC | PRN
  Administered 2022-03-21: 1.6 mL via INTRATHECAL

## 2022-03-21 MED ORDER — METHYLERGONOVINE MALEATE 0.2 MG PO TABS
0.2000 mg | ORAL_TABLET | Freq: Four times a day (QID) | ORAL | Status: AC
  Administered 2022-03-21 – 2022-03-22 (×4): 0.2 mg via ORAL
  Filled 2022-03-21 (×4): qty 1

## 2022-03-21 MED ORDER — DIPHENHYDRAMINE HCL 25 MG PO CAPS: 25.0000 mg | ORAL_CAPSULE | Freq: Four times a day (QID) | ORAL | Status: DC | PRN

## 2022-03-21 MED ORDER — SODIUM CHLORIDE 0.9% IV SOLUTION: Freq: Once | INTRAVENOUS | Status: DC

## 2022-03-21 MED ORDER — NALOXONE HCL 4 MG/10ML IJ SOLN: 1.0000 ug/kg/h | INTRAVENOUS | Status: DC | PRN

## 2022-03-21 MED ORDER — ZOLPIDEM TARTRATE 5 MG PO TABS: 5.0000 mg | ORAL_TABLET | Freq: Every evening | ORAL | Status: DC | PRN

## 2022-03-21 MED ORDER — MEPERIDINE HCL 25 MG/ML IJ SOLN: 6.2500 mg | INTRAMUSCULAR | Status: DC | PRN

## 2022-03-21 MED ORDER — SCOPOLAMINE 1 MG/3DAYS TD PT72
MEDICATED_PATCH | TRANSDERMAL | Status: AC
  Filled 2022-03-21: qty 1

## 2022-03-21 MED ORDER — IBUPROFEN 600 MG PO TABS
600.0000 mg | ORAL_TABLET | Freq: Four times a day (QID) | ORAL | Status: DC | PRN
  Administered 2022-03-22 – 2022-03-24 (×8): 600 mg via ORAL
  Filled 2022-03-21 (×8): qty 1

## 2022-03-21 MED ORDER — WITCH HAZEL-GLYCERIN EX PADS: 1.0000 | MEDICATED_PAD | CUTANEOUS | Status: DC | PRN

## 2022-03-21 MED ORDER — DIBUCAINE (PERIANAL) 1 % EX OINT: 1.0000 | TOPICAL_OINTMENT | CUTANEOUS | Status: DC | PRN

## 2022-03-21 MED ORDER — PHENYLEPHRINE 80 MCG/ML (10ML) SYRINGE FOR IV PUSH (FOR BLOOD PRESSURE SUPPORT)
PREFILLED_SYRINGE | INTRAVENOUS | Status: AC
  Filled 2022-03-21: qty 10

## 2022-03-21 MED ORDER — ONDANSETRON HCL 4 MG/2ML IJ SOLN
INTRAMUSCULAR | Status: AC
  Filled 2022-03-21: qty 2

## 2022-03-21 MED ORDER — OXYCODONE-ACETAMINOPHEN 5-325 MG PO TABS
1.0000 | ORAL_TABLET | ORAL | Status: DC | PRN
  Filled 2022-03-21: qty 1

## 2022-03-21 MED ORDER — NALOXONE HCL 0.4 MG/ML IJ SOLN: 0.4000 mg | INTRAMUSCULAR | Status: DC | PRN

## 2022-03-21 MED ORDER — ACETAMINOPHEN 10 MG/ML IV SOLN: 1000.0000 mg | Freq: Once | INTRAVENOUS | Status: DC | PRN

## 2022-03-21 MED ORDER — DEXAMETHASONE SODIUM PHOSPHATE 10 MG/ML IJ SOLN
INTRAMUSCULAR | Status: DC | PRN
  Administered 2022-03-21: 4 mg via INTRAVENOUS

## 2022-03-21 MED ORDER — ALBUMIN HUMAN 5 % IV SOLN
INTRAVENOUS | Status: AC
  Filled 2022-03-21: qty 250

## 2022-03-21 MED ORDER — LACTATED RINGERS IV SOLN: INTRAVENOUS | Status: DC

## 2022-03-21 MED ORDER — ONDANSETRON HCL 4 MG/2ML IJ SOLN: 4.0000 mg | Freq: Three times a day (TID) | INTRAMUSCULAR | Status: DC | PRN

## 2022-03-21 MED ORDER — HYDROMORPHONE HCL 1 MG/ML IJ SOLN: 0.2500 mg | INTRAMUSCULAR | Status: DC | PRN

## 2022-03-21 MED ORDER — SCOPOLAMINE 1 MG/3DAYS TD PT72
1.0000 | MEDICATED_PATCH | Freq: Once | TRANSDERMAL | Status: AC
  Administered 2022-03-21: 1.5 mg via TRANSDERMAL

## 2022-03-21 MED ORDER — FENTANYL CITRATE (PF) 100 MCG/2ML IJ SOLN
INTRAMUSCULAR | Status: DC | PRN
  Administered 2022-03-21: 15 ug via INTRATHECAL

## 2022-03-21 MED ORDER — ACETAMINOPHEN 10 MG/ML IV SOLN
INTRAVENOUS | Status: AC
  Filled 2022-03-21: qty 100

## 2022-03-21 MED ORDER — OXYTOCIN-SODIUM CHLORIDE 30-0.9 UT/500ML-% IV SOLN
INTRAVENOUS | Status: DC | PRN
  Administered 2022-03-21: 300 mL via INTRAVENOUS

## 2022-03-21 MED ORDER — SIMETHICONE 80 MG PO CHEW
80.0000 mg | CHEWABLE_TABLET | ORAL | Status: DC | PRN
  Administered 2022-03-22: 80 mg via ORAL
  Filled 2022-03-21: qty 1

## 2022-03-21 MED ORDER — DIPHENHYDRAMINE HCL 50 MG/ML IJ SOLN: 12.5000 mg | INTRAMUSCULAR | Status: DC | PRN

## 2022-03-21 MED ORDER — OXYCODONE HCL 5 MG PO TABS: 5.0000 mg | ORAL_TABLET | Freq: Once | ORAL | Status: DC | PRN

## 2022-03-21 MED ORDER — PHENYLEPHRINE HCL-NACL 20-0.9 MG/250ML-% IV SOLN
INTRAVENOUS | Status: AC
  Filled 2022-03-21: qty 250

## 2022-03-21 MED ORDER — OXYCODONE HCL 5 MG/5ML PO SOLN: 5.0000 mg | Freq: Once | ORAL | Status: DC | PRN

## 2022-03-21 MED ORDER — CEFAZOLIN SODIUM-DEXTROSE 2-4 GM/100ML-% IV SOLN: 2.0000 g | Freq: Once | INTRAVENOUS | Status: DC

## 2022-03-21 MED ORDER — SODIUM CHLORIDE 0.9% FLUSH: 3.0000 mL | INTRAVENOUS | Status: DC | PRN

## 2022-03-21 MED ORDER — FENTANYL CITRATE (PF) 100 MCG/2ML IJ SOLN
INTRAMUSCULAR | Status: AC
  Filled 2022-03-21: qty 2

## 2022-03-21 MED ORDER — MENTHOL 3 MG MT LOZG: 1.0000 | LOZENGE | OROMUCOSAL | Status: DC | PRN

## 2022-03-21 MED ORDER — ACETAMINOPHEN 10 MG/ML IV SOLN
INTRAVENOUS | Status: DC | PRN
  Administered 2022-03-21: 1000 mg via INTRAVENOUS

## 2022-03-21 MED ORDER — POVIDONE-IODINE 10 % EX SWAB: 2.0000 | Freq: Once | CUTANEOUS | Status: DC

## 2022-03-21 MED ORDER — TRANEXAMIC ACID-NACL 1000-0.7 MG/100ML-% IV SOLN
INTRAVENOUS | Status: DC | PRN
  Administered 2022-03-21: 1000 mg via INTRAVENOUS

## 2022-03-21 MED ORDER — SENNOSIDES-DOCUSATE SODIUM 8.6-50 MG PO TABS
2.0000 | ORAL_TABLET | ORAL | Status: DC
  Administered 2022-03-21 – 2022-03-23 (×3): 2 via ORAL
  Filled 2022-03-21 (×3): qty 2

## 2022-03-21 MED ORDER — ALBUMIN HUMAN 5 % IV SOLN: INTRAVENOUS | Status: DC | PRN

## 2022-03-21 MED ORDER — CEFAZOLIN SODIUM-DEXTROSE 2-4 GM/100ML-% IV SOLN
2.0000 g | INTRAVENOUS | Status: AC
  Administered 2022-03-21 (×2): 2 g via INTRAVENOUS

## 2022-03-21 MED ORDER — SIMETHICONE 80 MG PO CHEW
80.0000 mg | CHEWABLE_TABLET | Freq: Three times a day (TID) | ORAL | Status: DC
  Administered 2022-03-21 – 2022-03-24 (×7): 80 mg via ORAL
  Filled 2022-03-21 (×7): qty 1

## 2022-03-21 MED ORDER — OXYTOCIN-SODIUM CHLORIDE 30-0.9 UT/500ML-% IV SOLN: 2.5000 [IU]/h | INTRAVENOUS | Status: AC

## 2022-03-21 MED ORDER — MORPHINE SULFATE (PF) 0.5 MG/ML IJ SOLN
INTRAMUSCULAR | Status: AC
  Filled 2022-03-21: qty 10

## 2022-03-21 MED ORDER — PHENYLEPHRINE HCL-NACL 20-0.9 MG/250ML-% IV SOLN
INTRAVENOUS | Status: DC | PRN
  Administered 2022-03-21: 30 ug/min via INTRAVENOUS

## 2022-03-21 MED ORDER — DIPHENHYDRAMINE HCL 25 MG PO CAPS
25.0000 mg | ORAL_CAPSULE | ORAL | Status: DC | PRN
  Administered 2022-03-21: 25 mg via ORAL
  Filled 2022-03-21: qty 1

## 2022-03-21 MED ORDER — SODIUM CHLORIDE 0.9 % IV SOLN: INTRAVENOUS | Status: DC | PRN

## 2022-03-21 SURGICAL SUPPLY — 43 items
BENZOIN TINCTURE PRP APPL 2/3 (GAUZE/BANDAGES/DRESSINGS) ×1 IMPLANT
CHLORAPREP W/TINT 26ML (MISCELLANEOUS) ×2 IMPLANT
CLAMP UMBILICAL CORD (MISCELLANEOUS) ×1 IMPLANT
CLOTH BEACON ORANGE TIMEOUT ST (SAFETY) ×1 IMPLANT
DRAIN JACKSON PRT FLT 10 (DRAIN) IMPLANT
DRSG OPSITE POSTOP 4X10 (GAUZE/BANDAGES/DRESSINGS) ×1 IMPLANT
ELECT REM PT RETURN 9FT ADLT (ELECTROSURGICAL) ×1
ELECTRODE REM PT RTRN 9FT ADLT (ELECTROSURGICAL) ×1 IMPLANT
EVACUATOR SILICONE 100CC (DRAIN) IMPLANT
EXTRACTOR VACUUM M CUP 4 TUBE (SUCTIONS) IMPLANT
GLOVE BIO SURGEON STRL SZ 6.5 (GLOVE) ×1 IMPLANT
GLOVE BIOGEL PI IND STRL 7.0 (GLOVE) ×2 IMPLANT
GOWN STRL REUS W/TWL LRG LVL3 (GOWN DISPOSABLE) ×2 IMPLANT
HEMOSTAT ARISTA ABSORB 3G PWDR (HEMOSTASIS) IMPLANT
HEMOSTAT SURGICEL 2X14 (HEMOSTASIS) IMPLANT
KIT ABG SYR 3ML LUER SLIP (SYRINGE) IMPLANT
LIGASURE IMPACT 36 18CM CVD LR (INSTRUMENTS) IMPLANT
NDL HYPO 25X5/8 SAFETYGLIDE (NEEDLE) IMPLANT
NEEDLE HYPO 25X5/8 SAFETYGLIDE (NEEDLE) IMPLANT
NS IRRIG 1000ML POUR BTL (IV SOLUTION) ×1 IMPLANT
PACK C SECTION WH (CUSTOM PROCEDURE TRAY) ×1 IMPLANT
PAD OB MATERNITY 4.3X12.25 (PERSONAL CARE ITEMS) ×1 IMPLANT
RTRCTR C-SECT PINK 25CM LRG (MISCELLANEOUS) IMPLANT
STRIP CLOSURE SKIN 1/2X4 (GAUZE/BANDAGES/DRESSINGS) ×1 IMPLANT
SUT CHROMIC 0 CT 1 (SUTURE) ×1 IMPLANT
SUT MNCRL AB 3-0 PS2 27 (SUTURE) ×1 IMPLANT
SUT PLAIN 0 NONE (SUTURE) IMPLANT
SUT PLAIN 2 0 (SUTURE) ×2
SUT PLAIN 2 0 XLH (SUTURE) ×1 IMPLANT
SUT PLAIN ABS 2-0 CT1 27XMFL (SUTURE) ×2 IMPLANT
SUT SILK 2 0 SH (SUTURE) IMPLANT
SUT VIC AB 0 CTX 36 (SUTURE) ×5
SUT VIC AB 0 CTX36XBRD ANBCTRL (SUTURE) ×4 IMPLANT
SUT VIC AB 2-0 CT1 27 (SUTURE) ×2
SUT VIC AB 2-0 CT1 TAPERPNT 27 (SUTURE) IMPLANT
SUT VIC AB 2-0 SH 27 (SUTURE)
SUT VIC AB 2-0 SH 27XBRD (SUTURE) IMPLANT
SUT VIC AB 3-0 SH 27 (SUTURE) ×1
SUT VIC AB 3-0 SH 27X BRD (SUTURE) IMPLANT
SUT VIC AB 4-0 KS 27 (SUTURE) IMPLANT
TOWEL OR 17X24 6PK STRL BLUE (TOWEL DISPOSABLE) ×1 IMPLANT
TRAY FOLEY W/BAG SLVR 14FR LF (SET/KITS/TRAYS/PACK) ×1 IMPLANT
WATER STERILE IRR 1000ML POUR (IV SOLUTION) ×1 IMPLANT

## 2022-03-21 NOTE — Anesthesia Preprocedure Evaluation (Addendum)
Anesthesia Evaluation  Patient identified by MRN, date of birth, ID band Patient awake    Reviewed: Allergy & Precautions, H&P , NPO status , Patient's Chart, lab work & pertinent test results  History of Anesthesia Complications Negative for: history of anesthetic complications  Airway Mallampati: II  TM Distance: >3 FB     Dental   Pulmonary neg pulmonary ROS,    Pulmonary exam normal        Cardiovascular negative cardio ROS   Rhythm:regular Rate:Normal     Neuro/Psych negative neurological ROS  negative psych ROS   GI/Hepatic negative GI ROS, Neg liver ROS,   Endo/Other  negative endocrine ROS  Renal/GU negative Renal ROS  negative genitourinary   Musculoskeletal   Abdominal   Peds  Hematology  (+) Blood dyscrasia, anemia ,   Anesthesia Other Findings   Reproductive/Obstetrics (+) Pregnancy [id number] at 55w3dNo prior C/S                            Anesthesia Physical Anesthesia Plan  ASA: 3  Anesthesia Plan: Spinal   Post-op Pain Management:    Induction:   PONV Risk Score and Plan: Ondansetron and Treatment may vary due to age or medical condition  Airway Management Planned:   Additional Equipment:   Intra-op Plan:   Post-operative Plan:   Informed Consent: I have reviewed the patients History and Physical, chart, labs and discussed the procedure including the risks, benefits and alternatives for the proposed anesthesia with the patient or authorized representative who has indicated his/her understanding and acceptance.       Plan Discussed with: Anesthesiologist  Anesthesia Plan Comments:        Anesthesia Quick Evaluation

## 2022-03-21 NOTE — H&P (Signed)
Oumou Smead is a 36 y.o. 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  . OB History     Gravida  8   Para  3   Term  3   Preterm      AB  4   Living  3      SAB  4   IAB      Ectopic      Multiple  0   Live Births  3          Past Medical History:  Diagnosis Date   Anxiety    Blood transfusion without reported diagnosis    after third delivery   Fibroid    History of postpartum hemorrhage    Past Surgical History:  Procedure Laterality Date   NO PAST SURGERIES     Family History: family history includes Hypertension in her father and mother. Social History:  reports that she has never smoked. She has never used smokeless tobacco. She reports that she does not currently use alcohol. She reports that she does not use drugs.     Maternal Diabetes: No Genetic Screening: Normal Maternal Ultrasounds/Referrals: Normal Fetal Ultrasounds or other Referrals:  None Maternal Substance Abuse:  No Significant Maternal Medications:  None Significant Maternal Lab Results:  Group B Strep positive Number of Prenatal Visits:greater than 3 verified prenatal visits Other Comments:  None  Review of Systems History   Blood pressure 116/78, pulse 85, temperature 98.9 F (37.2 C), temperature source Oral, resp. rate 18, height '5\' 3"'$  (1.6 m), weight 96.2 kg, SpO2 100 %, unknown if currently breastfeeding. Exam Physical Exam  Physical Examination: General appearance - alert, well appearing, and in no distress Chest - clear to auscultation, no wheezes, rales or rhonchi, symmetric air entry Heart - normal rate and regular rhythm Abdomen - soft, nontender, nondistended, no masses or organomegaly gravid Extremities - peripheral pulses normal, no pedal edema, no clubbing or cyanosis, Homan's sign negative bilaterally  Prenatal labs: ABO, Rh: --/--/AB POS (09/19 0820) Antibody: NEG (09/19 0820) Rubella: Immune (06/20 0000) RPR: Nonreactive (06/20 0000)   HBsAg: Negative (06/20 0000)  HIV: Non-reactive (06/20 0000)  GBS: Positive/-- (08/22 0000)   Assessment/Plan: TERM  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 RISKS ARE BLEEDING , INFECTION , DAMAGE TO INTERNAL ORGANS LIKE BOWEL AND BLADDER PT DESIRES BTL WILL ATTEMPT SALPINGECTOMY.  R&B REVIEWED IN DETAIL   Kiondra Caicedo A Lyn Joens 03/21/2022, 11:21 AM

## 2022-03-21 NOTE — Lactation Note (Signed)
This note was copied from a baby's chart. Lactation Consultation Note  Patient Name: Jocelyn Lucas FUXNA'T Date: 03/21/2022 Reason for consult: Initial assessment;Term Age:38 hours  Experienced BF mom doesn't have any questions at this time. Mom stated baby has been BF good denies painful latches. Baby appears to be satisfied after feedings. Newborn feeding habits, STS, I&O supply and demand reviewed. Mom encouraged to feed baby 8-12 times/24 hours and with feeding cues.   Encouraged to call for assistance as needed.  Maternal Data Has patient been taught Hand Expression?: Yes Does the patient have breastfeeding experience prior to this delivery?: Yes How long did the patient breastfeed?: 1st child 1 yr, 2nd child 7 months, 3rd child 2 months  Feeding    LATCH Score Latch: Grasps breast easily, tongue down, lips flanged, rhythmical sucking.  Audible Swallowing: None  Type of Nipple: Everted at rest and after stimulation  Comfort (Breast/Nipple): Soft / non-tender  Hold (Positioning): No assistance needed to correctly position infant at breast.  LATCH Score: 8   Lactation Tools Discussed/Used    Interventions Interventions: Breast feeding basics reviewed;Skin to skin;Breast compression  Discharge    Consult Status Consult Status: Follow-up Date: 03/22/22 Follow-up type: In-patient    Theodoro Kalata 03/21/2022, 11:43 PM

## 2022-03-21 NOTE — Op Note (Addendum)
Cesarean Section Procedure Note   Jocelyn Lucas  03/21/2022  Indications:  HSV OUTBREAK AT TERM    Pre-operative Diagnosis: HERPES LESION/FLARE UP.   Post-operative Diagnosis: Same   Surgeon [place]  Assistants: DR Caron Presume  Anesthesia: spinal   Procedure Details:  The patient was seen in the Holding Room. The risks, benefits, complications, treatment options, and expected outcomes were discussed with the patient. The patient concurred with the proposed plan, giving informed consent. identified as Rhae Hammock and the procedure verified as C-Section Delivery. A Time Out was held and the above information confirmed.  After induction of anesthesia, the patient was draped and prepped in the usual sterile manner. A transverse incision was made and carried down through the subcutaneous tissue to the fascia. Fascial incision was made and extended transversely. The fascia was separated from the underlying rectus tissue superiorly and inferiorly. The peritoneum was identified and entered. Peritoneal incision was extended longitudinally. The utero-vesical peritoneal reflection was incised transversely and the bladder flap was bluntly freed from the lower uterine segment. A low transverse uterine incision was made. I TRIED TO avoid large sinuses. They still continued to drain.   Delivered from cephalic presentation was a 7-5 pound Female with Apgar scores of 8 at one minute and 9 at five minutes. Cord ph was not sent the umbilical cord was clamped and cut cord blood was obtained for evaluation. The placenta was removed Intact and appeared normal. The uterine outline, tubes and ovaries appeared normal}. The uterine incision was closed with running locked sutures of 0Vicryl. A second layer of 0 vicryl was used to imbricate the uterus.  Some bleeding was made hemostatic with figure of 8 suture of 0 vicryl  The patients left fallopian tube was grasped and excised using ligasure. The  patients right fallopian tube was followed out to the fimbriated end.  and excised in a similar fashion  Both p tubes was sent to pathology.  There was some oozing seen from the fundus and the left cornua.  These were made hemostatic with 2- vicryl stitches.      Hemostasis was observed. Lavage was carried out until clear. The fascia was then reapproximated with running sutures of 0Vicryl. The subcuticular closure was performed using 2-0plain gut. The skin was closed with 2-0Vicryl.   Instrument, sponge, and needle counts were correct prior the abdominal closure and were correct at the conclusion of the case.   My assistant was necessary for the complexity of the case.    Findings: FEMALE INFANT IN VTX PRES.  CLEAR FLUID   Estimated Blood Loss: 1510 CC  Total IV Fluids: 3249m   Urine Output:  150CC OF clear urine  Specimens: PLACENTA TO PATH  Complications: no complications  Disposition: PACU - hemodynamically stable.   Maternal Condition: stable   Baby condition / location:  Couplet care / Skin to Skin  Attending Attestation: I was present and scrubbed for the entire procedure.   Signed: Surgeon(s): DCrawford Givens MD CConcepcion Living MD

## 2022-03-21 NOTE — Anesthesia Procedure Notes (Signed)
Spinal  Patient location during procedure: OR Reason for block: surgical anesthesia Staffing Performed: anesthesiologist  Anesthesiologist: Tracie Lindbloom E, MD Performed by: Camara Renstrom E, MD Authorized by: Kaly Mcquary E, MD   Preanesthetic Checklist Completed: patient identified, IV checked, risks and benefits discussed, surgical consent, monitors and equipment checked, pre-op evaluation and timeout performed Spinal Block Patient position: sitting Prep: DuraPrep and site prepped and draped Patient monitoring: continuous pulse ox, blood pressure and heart rate Approach: midline Location: L3-4 Injection technique: single-shot Needle Needle type: Pencan  Needle gauge: 24 G Needle length: 10 cm Assessment Events: CSF return Additional Notes Functioning IV was confirmed and monitors were applied. Sterile prep and drape, including hand hygiene and sterile gloves were used. The patient was positioned and the spine was prepped. The skin was anesthetized with lidocaine.  Free flow of clear CSF was obtained prior to injecting local anesthetic into the CSF. The needle was carefully withdrawn. The patient tolerated the procedure well.      

## 2022-03-21 NOTE — Anesthesia Postprocedure Evaluation (Signed)
Anesthesia Post Note  Patient: Jocelyn Lucas  Procedure(s) Performed: [place]     Patient location during evaluation: PACU Anesthesia Type: Spinal Level of consciousness: oriented and awake and alert Pain management: pain level controlled Vital Signs Assessment: post-procedure vital signs reviewed and stable Respiratory status: spontaneous breathing, respiratory function stable and nonlabored ventilation Cardiovascular status: blood pressure returned to baseline and stable Postop Assessment: no headache, no backache, no apparent nausea or vomiting and spinal receding Anesthetic complications: no   No notable events documented.  Last Vitals:  Vitals:   03/21/22 1451 03/21/22 1503  BP:  100/62  Pulse: 84 71  Resp: 18 17  Temp: (!) 36.1 C 36.4 C  SpO2: 99%     Last Pain:  Vitals:   03/21/22 1505  TempSrc:   PainSc: 5    Pain Goal:    LLE Motor Response: Purposeful movement, Responds to commands (03/21/22 1445) LLE Sensation: Decreased (03/21/22 1445) RLE Motor Response: Purposeful movement, Responds to commands (03/21/22 1445) RLE Sensation: Decreased (03/21/22 1445) L Sensory Level: L2-Upper inner thigh, upper buttock (03/21/22 1445) R Sensory Level: L2-Upper inner thigh, upper buttock (03/21/22 1445) Epidural/Spinal Function Cutaneous sensation: Normal sensation (03/21/22 1505), Patient able to flex knees: Yes (03/21/22 1505), Patient able to lift hips off bed: No (03/21/22 1505), Back pain beyond tenderness at insertion site: No (03/21/22 1505), Progressively worsening motor and/or sensory loss: No (03/21/22 1505), Bowel and/or bladder incontinence post epidural: No (03/21/22 1505)  Lidia Collum

## 2022-03-21 NOTE — Transfer of Care (Signed)
Immediate Anesthesia Transfer of Care Note  Patient: Jocelyn Lucas  Procedure(s) Performed: CESAREAN SECTION  Patient Location: PACU  Anesthesia Type:Spinal  Level of Consciousness: awake, alert  and oriented  Airway & Oxygen Therapy: Patient Spontanous Breathing  Post-op Assessment: Report given to RN and Post -op Vital signs reviewed and stable  Post vital signs: Reviewed and stable  Last Vitals:  Vitals Value Taken Time  BP 99/58 03/21/22 1332  Temp 36.4 C 03/21/22 1332  Pulse 99 03/21/22 1341  Resp 18 03/21/22 1341  SpO2 100 % 03/21/22 1341  Vitals shown include unvalidated device data.  Last Pain:  Vitals:   03/21/22 1332  TempSrc: Oral         Complications: No notable events documented.

## 2022-03-21 NOTE — Lactation Note (Signed)
This note was copied from a baby's chart. Lactation Consultation Note  Patient Name: Jocelyn Lucas IRCVE'L Date: 03/21/2022   Age:38 hours  LC attempted to visit with the parent, but the RN was in the room assisting the patient with using the restroom and assessing the infant. Someone from lactation will follow up later.  Maternal Data    Feeding    LATCH Score                    Lactation Tools Discussed/Used    Interventions    Discharge    Consult Status      [name] 03/21/2022, 11:17 PM

## 2022-03-22 LAB — TYPE AND SCREEN
ABO/RH(D): AB POS
Antibody Screen: NEGATIVE
Unit division: 0
Unit division: 0

## 2022-03-22 LAB — CBC
HCT: 29.2 % — ABNORMAL LOW (ref 36.0–46.0)
Hemoglobin: 9.5 g/dL — ABNORMAL LOW (ref 12.0–15.0)
MCH: 25.5 pg — ABNORMAL LOW (ref 26.0–34.0)
MCHC: 32.5 g/dL (ref 30.0–36.0)
MCV: 78.5 fL — ABNORMAL LOW (ref 80.0–100.0)
Platelets: 273 10*3/uL (ref 150–400)
RBC: 3.72 MIL/uL — ABNORMAL LOW (ref 3.87–5.11)
RDW: 16.2 % — ABNORMAL HIGH (ref 11.5–15.5)
WBC: 13.6 10*3/uL — ABNORMAL HIGH (ref 4.0–10.5)
nRBC: 0 % (ref 0.0–0.2)

## 2022-03-22 LAB — BPAM RBC
Blood Product Expiration Date: 202310122359
Blood Product Expiration Date: 202310172359
ISSUE DATE / TIME: 202309191256
ISSUE DATE / TIME: 202309191548
Unit Type and Rh: 7300
Unit Type and Rh: 8400

## 2022-03-22 MED ORDER — ACETAMINOPHEN 500 MG PO TABS
1000.0000 mg | ORAL_TABLET | Freq: Four times a day (QID) | ORAL | Status: DC | PRN
  Administered 2022-03-22 – 2022-03-24 (×7): 1000 mg via ORAL
  Filled 2022-03-22 (×8): qty 2

## 2022-03-22 MED ORDER — SODIUM CHLORIDE 0.9 % IV SOLN
INTRAVENOUS | Status: AC
  Filled 2022-03-22: qty 5

## 2022-03-22 NOTE — Progress Notes (Signed)
Subjective: Postpartum Day 1: Cesarean Delivery and Bilateral Salpingectomy Patient reports incisional pain, tolerating PO, and no problems voiding.  Denies lightheadedness or dizziness with ambulation.  Objective: Vital signs in last 24 hours: Temp:  [97.7 F (36.5 C)-98.7 F (37.1 C)] 98.4 F (36.9 C) (09/20 1300) Pulse Rate:  [66-84] 77 (09/20 1300) Resp:  [18-20] 20 (09/20 1300) BP: (89-104)/(55-64) 96/57 (09/20 1300) SpO2:  [99 %-100 %] 99 % (09/20 0334)  Physical Exam:  General: alert and no distress Lochia: appropriate Uterine Fundus: FF, NT Incision: dressing c/d/i DVT Evaluation: No evidence of DVT seen on physical exam.  Recent Labs    03/21/22 1244 03/22/22 0638  HGB 7.1* 9.5*  HCT 21.0* 29.2*    Assessment/Plan: Status post Cesarean section and bilateral salpingectomy. Postoperative course complicated by asymptomatic anemia s/p 2u PRBCs d/t PPH at time of c/s.   Continue current care. Ambulating without difficulty Voiding without difficulty Tolerating po Pt interested in discharge tomorrow  Delice Lesch, MD 03/22/2022, 5:25 PM

## 2022-03-22 NOTE — Social Work (Addendum)
MOB was referred for history of depression/anxiety.  * Referral screened out by Clinical Social Worker because none of the following criteria appear to apply:  ~ History of anxiety/depression during this pregnancy, or of post-partum depression following prior delivery. ~ Diagnosis of anxiety and/or depression within last 3 years. Per chart review  according to documentation on 09/13/2020, MOB denied history of depression and anxiety. MOB stated she has no mental health diagnosis.  OR * MOB's symptoms currently being treated with medication and/or therapy.  Please contact the Clinical Social Worker if needs arise, by [facility] request, or if MOB scores greater than 9/yes to question 10 on [place] Postpartum Depression Screen.   Kathrin Greathouse, MSW, LCSW Women's and [place] Worker  [phone] 03/22/2022  9:07 AM

## 2022-03-23 ENCOUNTER — Encounter (HOSPITAL_COMMUNITY): Payer: Self-pay | Admitting: Obstetrics and Gynecology

## 2022-03-23 LAB — SURGICAL PATHOLOGY

## 2022-03-23 NOTE — Progress Notes (Signed)
Subjective: Postpartum Day 2: Cesarean Delivery Patient reports tolerating PO and no problems voiding.    Objective: Vital signs in last 24 hours: Temp:  [98.9 F (37.2 C)] 98.9 F (37.2 C) (09/20 2113) Pulse Rate:  [92] 92 (09/20 2113) Resp:  [20] 20 (09/20 2113) BP: (106)/(57) 106/57 (09/20 2113) SpO2:  [100 %] 100 % (09/20 2113)  Physical Exam:  General: alert, cooperative, and no distress Lochia: appropriate Uterine Fundus: firm Incision: Honey comb dressing with small dried blood, marked.  DVT Evaluation: No evidence of DVT seen on physical exam. No significant calf/ankle edema.  Recent Labs    03/21/22 1244 03/22/22 0638  HGB 7.1* 9.5*  HCT 21.0* 29.2*    Assessment/Plan: 36 y/o POD #  2 Status post Cesarean section and BTL. Postoperative course complicated by Postpartum hemorrhage and s/p 2 U PRBC transfusion, stable   Continue current care. Plan for discharge tomorrow.   Archie Endo, MD 03/23/2022.

## 2022-03-23 NOTE — Lactation Note (Addendum)
This note was copied from a baby's chart. Lactation Consultation Note  Patient Name: Jocelyn Lucas Date: 03/23/2022   Age:38 hours Per RN Dannielle Karvonen),  Birth Parent and infant was sleeping at this time.  Maternal Data    Feeding    LATCH Score                    Lactation Tools Discussed/Used    Interventions    Discharge    Consult Status      Jocelyn Lucas 03/23/2022, 12:25 AM

## 2022-03-23 NOTE — Lactation Note (Signed)
This note was copied from a baby's chart. Lactation Consultation Note  Patient Name: Jocelyn Lucas [id number] Date: 03/23/2022 Reason for consult: Follow-up assessment Age:38 hours  P4, Baby recently breastfed for 25 min. Mother denies questions or concerns. Discussed breastfeeding on both breasts per session.  Lactation to follow up as needed.   Feeding Mother's Current Feeding Choice: Breast Milk  LATCH Score Latch: Grasps breast easily, tongue down, lips flanged, rhythmical sucking.  Audible Swallowing: A few with stimulation  Type of Nipple: Everted at rest and after stimulation  Comfort (Breast/Nipple): Soft / non-tender  Hold (Positioning): No assistance needed to correctly position infant at breast.  LATCH Score: 9    Consult Status Consult Status: Follow-up Date: 03/23/22 Follow-up type: In-patient    Vivianne Master [facility] 03/23/2022, 10:37 AM

## 2022-03-24 MED ORDER — PRENATAL MULTIVITAMIN CH: 1.0000 | ORAL_TABLET | Freq: Every day | ORAL | 1 refills | Status: AC

## 2022-03-24 MED ORDER — SIMETHICONE 80 MG PO CHEW: 80.0000 mg | CHEWABLE_TABLET | Freq: Three times a day (TID) | ORAL | 0 refills | Status: AC

## 2022-03-24 MED ORDER — IBUPROFEN 600 MG PO TABS: 600.0000 mg | ORAL_TABLET | Freq: Four times a day (QID) | ORAL | 0 refills | Status: AC | PRN

## 2022-03-24 MED ORDER — DIBUCAINE (PERIANAL) 1 % EX OINT: 1.0000 | TOPICAL_OINTMENT | CUTANEOUS | 1 refills | Status: AC | PRN

## 2022-03-24 MED ORDER — SENNOSIDES-DOCUSATE SODIUM 8.6-50 MG PO TABS: 1.0000 | ORAL_TABLET | Freq: Two times a day (BID) | ORAL | 0 refills | Status: AC

## 2022-03-24 MED ORDER — OXYCODONE-ACETAMINOPHEN 5-325 MG PO TABS: 1.0000 | ORAL_TABLET | ORAL | 0 refills | Status: AC | PRN

## 2022-03-24 MED ORDER — WITCH HAZEL-GLYCERIN EX PADS: 1.0000 | MEDICATED_PAD | CUTANEOUS | 12 refills | Status: AC | PRN

## 2022-03-24 NOTE — Lactation Note (Addendum)
This note was copied from a baby's chart. Lactation Consultation Note  Patient Name: Jocelyn Lucas [id number] Date: 03/24/2022 Reason for consult: Follow-up assessment;Term;Infant weight loss;Nipple pain/trauma (7% weight loss, per Birth parent has supplemented due to sore nipples without breakdown. Has been wearing shells, milk is coming in. Was given a NS by the RN, not feeling any better. LC discussed transient soreness due to milk coming in.) Age:38 hours LC reviewed BF D/C teaching and :C resources.   Maternal Data    Feeding Mother's Current Feeding Choice: Breast Milk and Formula Lactation Tools Discussed/Used  Shells , hand pump with #21 F #24 F #27 F ( #24 F good fit for today   Interventions Interventions: Breast feeding basics reviewed;Shells;Hand pump;Education;LC Services brochure  Discharge Discharge Education: Engorgement and breast care;Warning signs for feeding baby Pump: Manual;Personal  Consult Status Consult Status: Complete Date: 03/24/22    Myer Haff 03/24/2022, 8:25 AM

## 2022-03-24 NOTE — Discharge Summary (Signed)
Postpartum Discharge Summary  Date of Service updated9/22/23     Patient Name: Jocelyn Lucas DOB: [birth year] MRN: [id number]  Date of admission: 03/21/2022 Delivery date:03/21/2022  Delivering provider: Crawford Givens  Date of discharge: 03/24/2022  Admitting diagnosis: S/P cesarean section [Z98.891] Intrauterine pregnancy: [redacted]w[redacted]d    Secondary diagnosis:  Principal Problem:   S/P cesarean section  Additional problems: anemia    Discharge diagnosis: Term Pregnancy Delivered                                              Post partum procedures:blood transfusion Augmentation: N/A Complications: None  Hospital course: Sceduled C/S   36y.o. yo [id number] admitted to the hospital 03/21/2022 for scheduled cesarean section with the following indication: active HSV  .Delivery details are as follows:  Membrane Rupture Time/Date:  ,   Delivery Method:C-Section, Low Transverse  Details of operation can be found in separate operative note.  Patient had an uncomplicated postpartum course.  She is ambulating, tolerating a regular diet, passing flatus, and urinating well. Patient is discharged home in stable condition on  03/24/22        Newborn Data: Birth date:03/21/2022  Birth time:12:07 PM  Gender:Female  Living status:Living  Apgars:8 ,9  Weight:3340 g     Magnesium Sulfate received: No BMZ received: No Rhophylac:No MMR:No T-DaP:   Flu: No Transfusion:Yes  Physical exam  Vitals:   03/22/22 1300 03/22/22 2113 03/23/22 2034 03/24/22 0557  BP: (!) 96/57 (!) 106/57 112/65 106/61  Pulse: 77 92 97 85  Resp: 20 20 18 16   Temp: 98.4 F (36.9 C) 98.9 F (37.2 C) 98.8 F (37.1 C) 98 F (36.7 C)  TempSrc: Oral Oral Oral Oral  SpO2:  100%    Weight:      Height:       General: alert and cooperative Lochia: appropriate Uterine Fundus: firm Incision: Healing well with no significant drainage, Dressing is clean, dry, and intact DVT Evaluation: Negative Homan's  sign. Labs: Lab Results  Component Value Date   WBC 13.6 (H) 03/22/2022   HGB 9.5 (L) 03/22/2022   HCT 29.2 (L) 03/22/2022   MCV 78.5 (L) 03/22/2022   PLT 273 03/22/2022      Latest Ref Rng & Units 03/21/2022   12:44 PM  CMP  Sodium 135 - 145 mmol/L 136   Potassium 3.5 - 5.1 mmol/L 3.6    Edinburgh Score:    03/23/2022    6:40 PM  Edinburgh Postnatal Depression Scale Screening Tool  I have been able to laugh and see the funny side of things. 0  I have looked forward with enjoyment to things. 0  I have blamed myself unnecessarily when things went wrong. 0  I have been anxious or worried for no good reason. 0  I have felt scared or panicky for no good reason. 0  Things have been getting on top of me. 0  I have been so unhappy that I have had difficulty sleeping. 0  I have felt sad or miserable. 0  I have been so unhappy that I have been crying. 0  The thought of harming myself has occurred to me. 0  Edinburgh Postnatal Depression Scale Total 0      After visit meds:  Allergies as of 03/24/2022   No Known Allergies  Medication List     STOP taking these medications    valACYclovir 500 MG tablet Commonly known as: VALTREX       TAKE these medications    dibucaine 1 % Oint Commonly known as: NUPERCAINAL Place 1 Application rectally as needed for hemorrhoids.   ibuprofen 600 MG tablet Commonly known as: ADVIL Take 1 tablet (600 mg total) by mouth every 6 (six) hours as needed for mild pain.   oxyCODONE-acetaminophen 5-325 MG tablet Commonly known as: PERCOCET/ROXICET Take 1-2 tablets by mouth every 4 (four) hours as needed for moderate pain.   prenatal multivitamin Tabs tablet Take 1 tablet by mouth daily at 12 noon. Start taking on: March 25, 2022   senna-docusate 8.6-50 MG tablet Commonly known as: Senokot-S Take 1 tablet by mouth 2 (two) times daily.   simethicone 80 MG chewable tablet Commonly known as: MYLICON Chew 1 tablet (80 mg  total) by mouth 3 (three) times daily after meals.   witch hazel-glycerin pad Commonly known as: TUCKS Apply 1 Application topically as needed for hemorrhoids.         Discharge home in stable condition Infant Feeding: Breast Infant Disposition:home with mother Discharge instruction: per After Visit Summary and Postpartum booklet. Activity: Advance as tolerated. Pelvic rest for 6 weeks.  Diet: routine diet Anticipated Birth Control:  B salpingectomy with CS Postpartum Appointment:6 weeks Additional Postpartum F/U:    Future Appointments:No future appointments. Follow up Visit:      03/24/2022 Betsy Coder, MD

## 2022-03-29 ENCOUNTER — Telehealth (HOSPITAL_COMMUNITY): Payer: Self-pay | Admitting: *Deleted

## 2022-03-29 NOTE — Telephone Encounter (Signed)
Left phone voicemail message.  Odis Hollingshead, RN 03-29-2022 at 3:20pm

## 2022-10-31 ENCOUNTER — Ambulatory Visit
Admission: RE | Admit: 2022-10-31 | Discharge: 2022-10-31 | Disposition: A | Discharge status: Home / Self Care | Payer: Medicaid Other | Source: Ambulatory Visit

## 2022-10-31 VITALS — BP 116/81 | HR 91 | Temp 98.1°F | Resp 16

## 2022-10-31 DIAGNOSIS — M79601 Pain in right arm: Secondary | ICD-10-CM

## 2022-10-31 MED ORDER — PREDNISONE 20 MG PO TABS: 40.0000 mg | ORAL_TABLET | Freq: Every day | ORAL | 0 refills | Status: AC

## 2022-10-31 NOTE — ED Provider Notes (Signed)
[facility]    CSN: [id number] Arrival date & time: 10/31/22  1115      History   Chief Complaint Chief Complaint  Patient presents with   Arm Pain    HPI Jocelyn Lucas is a 37 y.o. female.   Patient presents with right upper extremity pain that started about a week ago.  Patient denies any injury to the area.  Reports that it started with a "lightning pain" that shot down her arm and started at her shoulder.  She reports that the pain seems to radiate from her neck down her arm causing some intermittent numbness of her right hand.  Patient has used several different over-the-counter medications, heating pad, Biofreeze with no improvement of symptoms.  Denies history of the same.  Patient reports that she sits at a computer typing on a daily basis but is not sure if this is related.   Arm Pain    Past Medical History:  Diagnosis Date   Anxiety    Blood transfusion without reported diagnosis    after third delivery   Fibroid    History of postpartum hemorrhage     Patient Active Problem List   Diagnosis Date Noted   S/P cesarean section 03/21/2022   SVD (3/13) 09/12/2020   IDA (iron deficiency anemia) 09/12/2020   Polyhydramnios 09/11/2020    Past Surgical History:  Procedure Laterality Date   CESAREAN SECTION N/A 03/21/2022   Procedure: CESAREAN SECTION;  Surgeon: Jaymes Graff, MD;  Location: [facility];  Service: Obstetrics;  Laterality: N/A;   NO PAST SURGERIES      OB History     Gravida  8   Para  4   Term  4   Preterm      AB  4   Living  4      SAB  4   IAB      Ectopic      Multiple  0   Live Births  4            Home Medications    Prior to Admission medications   Medication Sig Start Date End Date Taking? Authorizing Provider  predniSONE (DELTASONE) 20 MG tablet Take 2 tablets (40 mg total) by mouth daily for 5 days. 10/31/22 11/05/22 Yes Hanaa Payes, Acie Fredrickson, FNP  dibucaine (NUPERCAINAL) 1 % OINT Place 1  Application rectally as needed for hemorrhoids. 03/24/22   Jaymes Graff, MD  ibuprofen (ADVIL) 600 MG tablet Take 1 tablet (600 mg total) by mouth every 6 (six) hours as needed for mild pain. 03/24/22   Jaymes Graff, MD  oxyCODONE-acetaminophen (PERCOCET/ROXICET) 5-325 MG tablet Take 1-2 tablets by mouth every 4 (four) hours as needed for moderate pain. 03/24/22   Jaymes Graff, MD  Prenatal Vit-Fe Fumarate-FA (PRENATAL MULTIVITAMIN) TABS tablet Take 1 tablet by mouth daily at 12 noon. 03/25/22   Dillard, Samule Ohm, MD  senna-docusate (SENOKOT-S) 8.6-50 MG tablet Take 1 tablet by mouth 2 (two) times daily. 03/24/22   Jaymes Graff, MD  simethicone (MYLICON) 80 MG chewable tablet Chew 1 tablet (80 mg total) by mouth 3 (three) times daily after meals. 03/24/22   Jaymes Graff, MD  valACYclovir (VALTREX) 500 MG tablet Take 500 mg by mouth 2 (two) times daily.    [provider]  witch hazel-glycerin (TUCKS) pad Apply 1 Application topically as needed for hemorrhoids. 03/24/22   Jaymes Graff, MD    Family History Family History  Problem Relation Age of Onset  Hypertension Mother    Hypertension Father    ADD / ADHD Neg Hx    Alcohol abuse Neg Hx    Anxiety disorder Neg Hx    Arthritis Neg Hx    Asthma Neg Hx    Birth defects Neg Hx    Cancer Neg Hx    COPD Neg Hx    Depression Neg Hx    Diabetes Neg Hx    Drug abuse Neg Hx    Early death Neg Hx    Heart disease Neg Hx    Hearing loss Neg Hx    Intellectual disability Neg Hx    Kidney disease Neg Hx    Learning disabilities Neg Hx    Miscarriages / Stillbirths Neg Hx    Obesity Neg Hx    Stroke Neg Hx    Vision loss Neg Hx    Hyperlipidemia Neg Hx    Varicose Veins Neg Hx     Social History Social History   Tobacco Use   Smoking status: Never   Smokeless tobacco: Never  Vaping Use   Vaping Use: Never used  Substance Use Topics   Alcohol use: Not Currently    Comment: occ   Drug use: No     Allergies    Patient has no known allergies.   Review of Systems Review of Systems Per HPI  Physical Exam Triage Vital Signs ED Triage Vitals [10/31/22 1123]  Enc Vitals Group     BP 116/81     Pulse Rate 91     Resp 16     Temp 98.1 F (36.7 C)     Temp Source Oral     SpO2 98 %     Weight      Height      Head Circumference      Peak Flow      Pain Score 7     Pain Loc      Pain Edu?      Excl. in GC?    No data found.  Updated Vital Signs BP 116/81 (BP Location: Left Arm)   Pulse 91   Temp 98.1 F (36.7 C) (Oral)   Resp 16   LMP 10/17/2022 (Approximate)   SpO2 98%   Visual Acuity Right Eye Distance:   Left Eye Distance:   Bilateral Distance:    Right Eye Near:   Left Eye Near:    Bilateral Near:     Physical Exam Constitutional:      General: She is not in acute distress.    Appearance: Normal appearance. She is not toxic-appearing or diaphoretic.  HENT:     Head: Normocephalic and atraumatic.  Eyes:     Extraocular Movements: Extraocular movements intact.     Conjunctiva/sclera: Conjunctivae normal.  Pulmonary:     Effort: Pulmonary effort is normal.  Musculoskeletal:     Comments: Tenderness to palpation to right lateral neck that extends into right upper trapezius muscle and surrounding right shoulder.  Grip strength is 5/5.  Neurovascularly intact with no swelling or discoloration. Full ROM of upper extremity present.   Neurological:     General: No focal deficit present.     Mental Status: She is alert and oriented to person, place, and time. Mental status is at baseline.  Psychiatric:        Mood and Affect: Mood normal.        Behavior: Behavior normal.        Thought Content: Thought  content normal.        Judgment: Judgment normal.      UC Treatments / Results  Labs (all labs ordered are listed, but only abnormal results are displayed) Labs Reviewed - No data to display  EKG   Radiology No results found.  Procedures Procedures  (including critical care time)  Medications Ordered in UC Medications - No data to display  Initial Impression / Assessment and Plan / UC Course  I have reviewed the triage vital signs and the nursing notes.  Pertinent labs & imaging results that were available during my care of the patient were reviewed by me and considered in my medical decision making (see chart for details).     Suspect possible cervical radiculopathy versus simple muscle strain.  Will treat with prednisone to decrease inflammation given symptoms have been refractory to over-the-counter medications.  Advised following up with orthopedist if symptoms persist or worsen.  Patient verbalized understanding and was agreeable with plan. Final Clinical Impressions(s) / UC Diagnoses   Final diagnoses:  Right arm pain     Discharge Instructions      I have prescribed you prednisone due to concern of radiation of pain from the neck.  This should help decrease inflammation and help alleviate your symptoms.  Follow-up with orthopedist if symptoms persist or worsen.    ED Prescriptions     Medication Sig Dispense Auth. Provider   predniSONE (DELTASONE) 20 MG tablet Take 2 tablets (40 mg total) by mouth daily for 5 days. 10 tablet Gustavus Bryant, [place]      PDMP not reviewed this encounter.   Gustavus Bryant, [place] 10/31/22 1202

## 2022-10-31 NOTE — Discharge Instructions (Signed)
I have prescribed you prednisone due to concern of radiation of pain from the neck.  This should help decrease inflammation and help alleviate your symptoms.  Follow-up with orthopedist if symptoms persist or worsen.

## 2022-10-31 NOTE — ED Triage Notes (Signed)
Pt states right arm pain that radiates down to her right hand that sometimes causes numbness to her hand.  Has tried heating pad and OTC meds with no relief.

## 2022-11-11 ENCOUNTER — Ambulatory Visit
Admission: RE | Admit: 2022-11-11 | Discharge: 2022-11-11 | Disposition: A | Discharge status: Home / Self Care | Payer: Medicaid Other | Source: Ambulatory Visit | Attending: Urgent Care | Admitting: Urgent Care

## 2022-11-11 VITALS — BP 126/83 | HR 90 | Temp 98.6°F | Resp 16

## 2022-11-11 DIAGNOSIS — A6000 Herpesviral infection of urogenital system, unspecified: Secondary | ICD-10-CM | POA: Diagnosis not present

## 2022-11-11 DIAGNOSIS — M5412 Radiculopathy, cervical region: Secondary | ICD-10-CM

## 2022-11-11 MED ORDER — PREDNISONE 50 MG PO TABS: 50.0000 mg | ORAL_TABLET | Freq: Every day | ORAL | 0 refills | Status: AC

## 2022-11-11 MED ORDER — VALACYCLOVIR HCL 1 G PO TABS: ORAL_TABLET | ORAL | 1 refills | Status: AC

## 2022-11-11 MED ORDER — FAMOTIDINE 20 MG PO TABS: 20.0000 mg | ORAL_TABLET | Freq: Two times a day (BID) | ORAL | 0 refills | Status: AC

## 2022-11-11 MED ORDER — CYCLOBENZAPRINE HCL 5 MG PO TABS: 5.0000 mg | ORAL_TABLET | Freq: Every evening | ORAL | 0 refills | Status: AC | PRN

## 2022-11-11 MED ORDER — OMEPRAZOLE 20 MG PO CPDR: 20.0000 mg | DELAYED_RELEASE_CAPSULE | Freq: Every day | ORAL | 0 refills | Status: AC

## 2022-11-11 NOTE — ED Triage Notes (Signed)
Pt reports RUE pain after waking up 2 weeks ago and hearing a pop-states she was seen at another UC-completed prednisone course-pain no better-not currently taking pain meds-NAD-steady gait

## 2022-11-11 NOTE — ED Provider Notes (Signed)
[facility]  Note:  This document was prepared using Conservation officer, historic buildings and may include unintentional dictation errors.  MRN: [id number] DOB: 04/12/1986  Subjective:   Jocelyn Lucas is a 37 y.o. female presenting for 2-week history of persistent right-sided upper arm pain between the trapezius down to the elbow and also numbness and tingling at the right hand.  Symptoms started after she was trying to do some stretching and heard a pop at the level of her shoulder.  The symptoms were quite severe then.  She was seen 10/31/2022.  Underwent a course of prednisone with some improvement but her symptoms have returned.  No fall, trauma, active neck pain.  No history of musculoskeletal disorders.  No rashes.  Patient would also like a medication refill of her valacyclovir for genital herpes.  Generally only has 1-2 outbreaks a year.  No current facility-administered medications for this encounter.  Current Outpatient Medications:  .  dibucaine (NUPERCAINAL) 1 % OINT, Place 1 Application rectally as needed for hemorrhoids., Disp: 60 g, Rfl: 1 .  ibuprofen (ADVIL) 600 MG tablet, Take 1 tablet (600 mg total) by mouth every 6 (six) hours as needed for mild pain., Disp: 30 tablet, Rfl: 0 .  oxyCODONE-acetaminophen (PERCOCET/ROXICET) 5-325 MG tablet, Take 1-2 tablets by mouth every 4 (four) hours as needed for moderate pain., Disp: 30 tablet, Rfl: 0 .  Prenatal Vit-Fe Fumarate-FA (PRENATAL MULTIVITAMIN) TABS tablet, Take 1 tablet by mouth daily at 12 noon., Disp: 90 tablet, Rfl: 1 .  senna-docusate (SENOKOT-S) 8.6-50 MG tablet, Take 1 tablet by mouth 2 (two) times daily., Disp: 60 tablet, Rfl: 0 .  simethicone (MYLICON) 80 MG chewable tablet, Chew 1 tablet (80 mg total) by mouth 3 (three) times daily after meals., Disp: 30 tablet, Rfl: 0 .  valACYclovir (VALTREX) 500 MG tablet, Take 500 mg by mouth 2 (two) times daily., Disp: , Rfl:  .  witch hazel-glycerin  (TUCKS) pad, Apply 1 Application topically as needed for hemorrhoids., Disp: 40 each, Rfl: 12   No Known Allergies  Past Medical History:  Diagnosis Date  . Anxiety   . Blood transfusion without reported diagnosis    after third delivery  . Fibroid   . History of postpartum hemorrhage      Past Surgical History:  Procedure Laterality Date  . CESAREAN SECTION N/A 03/21/2022   Procedure: CESAREAN SECTION;  Surgeon: Jaymes Graff, MD;  Location: [facility];  Service: Obstetrics;  Laterality: N/A;  . NO PAST SURGERIES      Family History  Problem Relation Age of Onset  . Hypertension Mother   . Hypertension Father   . ADD / ADHD Neg Hx   . Alcohol abuse Neg Hx   . Anxiety disorder Neg Hx   . Arthritis Neg Hx   . Asthma Neg Hx   . Birth defects Neg Hx   . Cancer Neg Hx   . COPD Neg Hx   . Depression Neg Hx   . Diabetes Neg Hx   . Drug abuse Neg Hx   . Early death Neg Hx   . Heart disease Neg Hx   . Hearing loss Neg Hx   . Intellectual disability Neg Hx   . Kidney disease Neg Hx   . Learning disabilities Neg Hx   . Miscarriages / Stillbirths Neg Hx   . Obesity Neg Hx   . Stroke Neg Hx   . Vision loss Neg Hx   .  Hyperlipidemia Neg Hx   . Varicose Veins Neg Hx     Social History   Tobacco Use  . Smoking status: Never  . Smokeless tobacco: Never  Vaping Use  . Vaping Use: Never used  Substance Use Topics  . Alcohol use: Yes    Comment: occ  . Drug use: No    ROS   Objective:   Vitals: BP 126/83 (BP Location: Right Arm)   Pulse 90   Temp 98.6 F (37 C) (Oral)   Resp 16   LMP 10/17/2022 (Approximate)   SpO2 98%   Physical Exam Constitutional:      General: She is not in acute distress.    Appearance: Normal appearance. She is well-developed. She is not ill-appearing, toxic-appearing or diaphoretic.  HENT:     Head: Normocephalic and atraumatic.     Right Ear: External ear normal.     Left Ear: External ear normal.     Nose: Nose normal.      Mouth/Throat:     Mouth: Mucous membranes are moist.  Eyes:     General: No scleral icterus.       Right eye: No discharge.        Left eye: No discharge.     Extraocular Movements: Extraocular movements intact.  Cardiovascular:     Rate and Rhythm: Normal rate.  Pulmonary:     Effort: Pulmonary effort is normal.  Musculoskeletal:     Right shoulder: No swelling, deformity, effusion, laceration, tenderness, bony tenderness or crepitus. Normal range of motion. Normal strength.     Right upper arm: No swelling, edema, deformity, lacerations, tenderness or bony tenderness.     Right elbow: No swelling, deformity, effusion or lacerations. Normal range of motion. No tenderness.     Right forearm: No swelling, edema, deformity, lacerations, tenderness or bony tenderness.     Right wrist: No swelling, deformity, effusion, lacerations, tenderness, bony tenderness, snuff box tenderness or crepitus. Normal range of motion.     Right hand: No swelling, deformity, lacerations, tenderness or bony tenderness. Normal range of motion. Normal strength. Normal sensation. Normal capillary refill.     Cervical back: Normal range of motion and neck supple. No swelling, edema, deformity, erythema, signs of trauma, lacerations, rigidity, spasms, torticollis, tenderness, bony tenderness or crepitus. No pain with movement, spinous process tenderness or muscular tenderness. Normal range of motion.     Comments: Positive Spurling maneuver to the right.  Skin:    General: Skin is warm and dry.  Neurological:     General: No focal deficit present.     Mental Status: She is alert and oriented to person, place, and time.     Cranial Nerves: No cranial nerve deficit.     Motor: No weakness.     Coordination: Coordination normal.     Gait: Gait normal.     Deep Tendon Reflexes: Reflexes normal.  Psychiatric:        Mood and Affect: Mood normal.        Behavior: Behavior normal.    Assessment and Plan :   PDMP  not reviewed this encounter.  1. Cervical radiculopathy   2. Genital herpes simplex, unspecified site    Patient has physical exam findings and symptoms very consistent with cervical radiculopathy.  She would like to trial 1 more round of prednisone as it did help.  Will use 50 mg for 5 days, use omeprazole and famotidine for GI protection.  Flexeril can also be used  for the muscle spasms she gets.  Follow-up with [facility].  I provided her with a medication refill of her valacyclovir and reviewed dosing for this.  Counseled patient on potential for adverse effects with medications prescribed/recommended today, ER and return-to-clinic precautions discussed, patient verbalized understanding.    Wallis Bamberg, [place] 11/11/22 [phone]

## 2023-04-15 ENCOUNTER — Emergency Department (HOSPITAL_COMMUNITY)
Admission: EM | Admit: 2023-04-15 | Discharge: 2023-04-16 | Discharge status: Against Medical Advice | Payer: Medicaid Other | Attending: Emergency Medicine | Admitting: Emergency Medicine

## 2023-04-15 ENCOUNTER — Other Ambulatory Visit: Payer: Self-pay

## 2023-04-15 ENCOUNTER — Encounter (HOSPITAL_COMMUNITY): Payer: Self-pay

## 2023-04-15 DIAGNOSIS — Z5321 Procedure and treatment not carried out due to patient leaving prior to being seen by health care provider: Secondary | ICD-10-CM | POA: Diagnosis not present

## 2023-04-15 DIAGNOSIS — Z113 Encounter for screening for infections with a predominantly sexual mode of transmission: Secondary | ICD-10-CM | POA: Insufficient documentation

## 2023-04-15 NOTE — ED Triage Notes (Signed)
Pt is here for an STI screen. She said that her partner was recently diagnosed with syphilis. Denies any symptoms at this moment.
